# Patient Record
Sex: Female | Born: 1985 | Hispanic: No | Marital: Married | Smoking: Never smoker
Health system: Southern US, Community
[De-identification: ages and names within clinical notes are randomized; demographics above are authoritative.]

## PROBLEM LIST (undated history)

## (undated) DIAGNOSIS — E669 Obesity, unspecified: Secondary | ICD-10-CM

## (undated) HISTORY — PX: BACK SURGERY: SHX140

## (undated) HISTORY — DX: Obesity, unspecified: E66.9

---
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
## 2003-01-23 ENCOUNTER — Emergency Department (HOSPITAL_COMMUNITY): Admission: EM | Admit: 2003-01-23 | Discharge: 2003-01-23 | Payer: Self-pay | Admitting: Emergency Medicine

## 2003-01-24 ENCOUNTER — Observation Stay (HOSPITAL_COMMUNITY): Admission: EM | Admit: 2003-01-24 | Discharge: 2003-01-25 | Payer: Self-pay | Admitting: Emergency Medicine

## 2003-04-27 ENCOUNTER — Emergency Department (HOSPITAL_COMMUNITY): Admission: EM | Admit: 2003-04-27 | Discharge: 2003-04-27 | Payer: Self-pay | Admitting: Family Medicine

## 2003-06-07 ENCOUNTER — Emergency Department (HOSPITAL_COMMUNITY): Admission: EM | Admit: 2003-06-07 | Discharge: 2003-06-08 | Payer: Self-pay | Admitting: Emergency Medicine

## 2003-06-22 ENCOUNTER — Emergency Department (HOSPITAL_COMMUNITY): Admission: AD | Admit: 2003-06-22 | Discharge: 2003-06-22 | Payer: Self-pay | Admitting: Emergency Medicine

## 2003-06-22 ENCOUNTER — Emergency Department (HOSPITAL_COMMUNITY): Admission: EM | Admit: 2003-06-22 | Discharge: 2003-06-23 | Payer: Self-pay | Admitting: Emergency Medicine

## 2004-01-10 ENCOUNTER — Ambulatory Visit: Payer: Self-pay | Admitting: Family Medicine

## 2004-01-28 ENCOUNTER — Ambulatory Visit: Payer: Self-pay | Admitting: Sports Medicine

## 2004-09-14 ENCOUNTER — Emergency Department (HOSPITAL_COMMUNITY): Admission: EM | Admit: 2004-09-14 | Discharge: 2004-09-15 | Payer: Self-pay | Admitting: Emergency Medicine

## 2004-11-26 ENCOUNTER — Emergency Department (HOSPITAL_COMMUNITY): Admission: EM | Admit: 2004-11-26 | Discharge: 2004-11-26 | Payer: Self-pay | Admitting: Emergency Medicine

## 2005-01-02 ENCOUNTER — Ambulatory Visit: Payer: Self-pay | Admitting: Family Medicine

## 2005-01-02 ENCOUNTER — Encounter: Admission: RE | Admit: 2005-01-02 | Discharge: 2005-01-02 | Payer: Self-pay | Admitting: Family Medicine

## 2005-01-09 ENCOUNTER — Encounter: Admission: RE | Admit: 2005-01-09 | Discharge: 2005-04-09 | Payer: Self-pay | Admitting: Sports Medicine

## 2005-01-23 ENCOUNTER — Ambulatory Visit: Payer: Self-pay | Admitting: Family Medicine

## 2005-05-05 ENCOUNTER — Emergency Department (HOSPITAL_COMMUNITY): Admission: EM | Admit: 2005-05-05 | Discharge: 2005-05-05 | Payer: Self-pay | Admitting: Emergency Medicine

## 2005-05-20 ENCOUNTER — Observation Stay (HOSPITAL_COMMUNITY): Admission: EM | Admit: 2005-05-20 | Discharge: 2005-05-20 | Payer: Self-pay | Admitting: Emergency Medicine

## 2005-12-27 ENCOUNTER — Inpatient Hospital Stay (HOSPITAL_COMMUNITY): Admission: RE | Admit: 2005-12-27 | Discharge: 2005-12-29 | Payer: Self-pay | Admitting: Obstetrics and Gynecology

## 2006-05-16 DIAGNOSIS — M4150 Other secondary scoliosis, site unspecified: Secondary | ICD-10-CM

## 2006-05-16 DIAGNOSIS — M545 Low back pain: Secondary | ICD-10-CM

## 2007-02-27 ENCOUNTER — Emergency Department (HOSPITAL_COMMUNITY): Admission: EM | Admit: 2007-02-27 | Discharge: 2007-02-27 | Payer: Self-pay | Admitting: Emergency Medicine

## 2007-07-27 ENCOUNTER — Emergency Department (HOSPITAL_COMMUNITY): Admission: EM | Admit: 2007-07-27 | Discharge: 2007-07-27 | Payer: Self-pay | Admitting: Emergency Medicine

## 2007-09-06 ENCOUNTER — Emergency Department (HOSPITAL_COMMUNITY): Admission: EM | Admit: 2007-09-06 | Discharge: 2007-09-06 | Payer: Self-pay | Admitting: Emergency Medicine

## 2008-02-26 ENCOUNTER — Emergency Department (HOSPITAL_COMMUNITY): Admission: EM | Admit: 2008-02-26 | Discharge: 2008-02-26 | Payer: Self-pay | Admitting: Emergency Medicine

## 2008-10-05 ENCOUNTER — Other Ambulatory Visit: Admission: RE | Admit: 2008-10-05 | Discharge: 2008-10-05 | Payer: Self-pay | Admitting: Obstetrics & Gynecology

## 2008-11-10 ENCOUNTER — Ambulatory Visit (HOSPITAL_COMMUNITY): Admission: RE | Admit: 2008-11-10 | Discharge: 2008-11-10 | Payer: Self-pay | Admitting: Obstetrics & Gynecology

## 2009-03-17 ENCOUNTER — Emergency Department (HOSPITAL_COMMUNITY): Admission: EM | Admit: 2009-03-17 | Discharge: 2009-03-17 | Payer: Self-pay | Admitting: Emergency Medicine

## 2009-08-12 ENCOUNTER — Emergency Department (HOSPITAL_COMMUNITY): Admission: EM | Admit: 2009-08-12 | Discharge: 2009-08-12 | Payer: Self-pay | Admitting: Emergency Medicine

## 2009-10-02 ENCOUNTER — Emergency Department (HOSPITAL_COMMUNITY): Admission: EM | Admit: 2009-10-02 | Discharge: 2009-10-02 | Payer: Self-pay | Admitting: Emergency Medicine

## 2009-11-09 ENCOUNTER — Emergency Department (HOSPITAL_COMMUNITY): Admission: EM | Admit: 2009-11-09 | Discharge: 2009-11-09 | Payer: Self-pay | Admitting: Emergency Medicine

## 2009-11-12 ENCOUNTER — Emergency Department (HOSPITAL_COMMUNITY): Admission: EM | Admit: 2009-11-12 | Discharge: 2009-11-12 | Payer: Self-pay | Admitting: Emergency Medicine

## 2010-04-17 ENCOUNTER — Other Ambulatory Visit (HOSPITAL_COMMUNITY): Payer: Self-pay | Admitting: Emergency Medicine

## 2010-04-17 ENCOUNTER — Emergency Department (HOSPITAL_COMMUNITY)
Admission: EM | Admit: 2010-04-17 | Discharge: 2010-04-17 | Payer: Self-pay | Source: Home / Self Care | Admitting: Emergency Medicine

## 2010-04-17 DIAGNOSIS — K829 Disease of gallbladder, unspecified: Secondary | ICD-10-CM

## 2010-04-17 LAB — HEPATIC FUNCTION PANEL
Albumin: 4.1 g/dL (ref 3.5–5.2)
Alkaline Phosphatase: 48 U/L (ref 39–117)
Bilirubin, Direct: <0.1 mg/dL (ref 0.0–0.3)
Total Bilirubin: 0.3 mg/dL (ref 0.3–1.2)
Total Protein: 6.7 g/dL (ref 6.0–8.3)

## 2010-04-17 LAB — URINALYSIS, ROUTINE W REFLEX MICROSCOPIC
Ketones, ur: NEGATIVE mg/dL
Leukocytes, UA: NEGATIVE
Nitrite: NEGATIVE
Protein, ur: NEGATIVE mg/dL
Specific Gravity, Urine: 1.015 (ref 1.005–1.030)
Urine Glucose, Fasting: NEGATIVE mg/dL
Urobilinogen, UA: 0.2 mg/dL (ref 0.0–1.0)
pH: 6 (ref 5.0–8.0)

## 2010-04-17 LAB — BASIC METABOLIC PANEL
CO2: 26 mEq/L (ref 19–32)
Calcium: 9.3 mg/dL (ref 8.4–10.5)
Chloride: 103 mEq/L (ref 96–112)
GFR calc Af Amer: >60 mL/min (ref >60)
GFR calc non Af Amer: >60 mL/min (ref >60)
Glucose, Bld: 85 mg/dL (ref 70–99)
Potassium: 3.6 mEq/L (ref 3.5–5.1)

## 2010-04-17 LAB — CBC
MCV: 86.1 fL (ref 78.0–100.0)
Platelets: 275 10*3/uL (ref 150–400)
RDW: 12 % (ref 11.5–15.5)

## 2010-04-17 LAB — DIFFERENTIAL
Basophils Relative: 0 % (ref 0–1)
Lymphs Abs: 2.1 10*3/uL (ref 0.7–4.0)
Neutrophils Relative %: 67 % (ref 43–77)

## 2010-04-17 LAB — PREGNANCY, URINE: Preg Test, Ur: NEGATIVE

## 2010-04-19 ENCOUNTER — Ambulatory Visit (HOSPITAL_COMMUNITY)
Admission: RE | Admit: 2010-04-19 | Discharge: 2010-04-19 | Disposition: A | Discharge status: Home / Self Care | Payer: Self-pay | Source: Ambulatory Visit | Attending: Emergency Medicine | Admitting: Emergency Medicine

## 2010-04-19 DIAGNOSIS — R109 Unspecified abdominal pain: Secondary | ICD-10-CM | POA: Insufficient documentation

## 2010-04-19 DIAGNOSIS — K829 Disease of gallbladder, unspecified: Secondary | ICD-10-CM

## 2010-06-24 LAB — URINALYSIS, ROUTINE W REFLEX MICROSCOPIC
Ketones, ur: NEGATIVE mg/dL
Leukocytes, UA: NEGATIVE
Nitrite: NEGATIVE
Protein, ur: NEGATIVE mg/dL
Specific Gravity, Urine: 1.02 (ref 1.005–1.030)
Urobilinogen, UA: 1 mg/dL (ref 0.0–1.0)

## 2010-06-24 LAB — COMPREHENSIVE METABOLIC PANEL
ALT: 32 U/L (ref 0–35)
Alkaline Phosphatase: 49 U/L (ref 39–117)
BUN: 8 mg/dL (ref 6–23)
CO2: 27 mEq/L (ref 19–32)
GFR calc non Af Amer: >60 mL/min (ref >60)
Glucose, Bld: 89 mg/dL (ref 70–99)
Potassium: 3.7 mEq/L (ref 3.5–5.1)
Total Bilirubin: 0.5 mg/dL (ref 0.3–1.2)

## 2010-06-24 LAB — CBC
HCT: 35.6 % — ABNORMAL LOW (ref 36.0–46.0)
Hemoglobin: 12.6 g/dL (ref 12.0–15.0)
Platelets: 252 10*3/uL (ref 150–400)
RBC: 3.95 MIL/uL (ref 3.87–5.11)
WBC: 5.7 10*3/uL (ref 4.0–10.5)

## 2010-06-24 LAB — HCG, QUANTITATIVE, PREGNANCY: hCG, Beta Chain, Quant, S: <2 m[IU]/mL (ref <5)

## 2010-08-01 NOTE — Op Note (Signed)
NAME:  Karen Rosales, Karen Rosales NO.:  [id number]   MEDICAL RECORD NO.:  [id number]          PATIENT TYPE:  AMB   LOCATION:  DAY                           FACILITY:  [facility]   PHYSICIAN:  Lazaro Arms, M.D.   DATE OF BIRTH:  04-24-1985   DATE OF PROCEDURE:  11/10/2008  DATE OF DISCHARGE:                               OPERATIVE REPORT   PREOPERATIVE DIAGNOSES:  1. Multiparous female desires permanent sterilization.  2. Desires removal of Implanon.   POSTOPERATIVE DIAGNOSES:  1. Multiparous female desires permanent sterilization.  2. Desires removal of Implanon.   PROCEDURE:  1. Laparoscopic tubal ligation using electrocautery.  2. Implanon removal from left upper arm.   SURGEON:  Lazaro Arms, MD   ANESTHESIA:  Spinal.   FINDINGS:  The patient had a normal uterus, tubes, and ovaries.  Intraperitoneal cavity was completely normal.   DESCRIPTION OF OPERATION:  The patient was taken to the operating room,  placed in the supine position, where she underwent general endotracheal  anesthesia, placed in dorsal spine position, prepped and draped in usual  sterile fashion.  The vagina was prepped as well.  She had a Hulka  tenaculum placed in the endometrium for uterine manipulation.  Incision  was made in the umbilicus and a non bladed trocar was used under direct  visualization placed on peritoneal cavity one pass had difficulty.  The  peritoneal cavity was insufflated on view of video laparoscope.  The  fallopian tubes were identified and burned.  No resistance up beyond  using electrocautery.  Approximately, 2-1/2-3 cm segment bilaterally was  burned.  Again, all the anatomy was normal.  The trocars removed.  Gas  allowed to escape.  The fascia was closed with a single interrupted  suture.  The skin was closed using skin staples.  The patient tolerated  the procedure well.  She experienced minimal blood loss.  The left upper  arm was prepped.  Incision was made.   Implanon device was removed and  then discarded, and it was dressed.  The patient was awakened from  anesthesia and taken to recovery room in good and stable condition.  All  counts were correct x3.     Lazaro Arms, M.D.  Electronically Signed    LHE/MEDQ  D:  11/10/2008  T:  11/10/2008  Job:  [id number]

## 2010-08-04 NOTE — H&P (Signed)
NAME:  Karen Rosales, Karen Rosales NO.:  [id number]   MEDICAL RECORD NO.:  [id number]          PATIENT TYPE:  INP   LOCATION:  A402                          FACILITY:  [facility]   PHYSICIAN:  Tilda Burrow, M.D. DATE OF BIRTH:  Oct 17, 1985   DATE OF ADMISSION:  DATE OF DISCHARGE:  LH                                HISTORY & PHYSICAL   ADMISSION DIAGNOSIS:  Pregnancy, 39+ weeks. Repeat cesarean section. Not for  trial of labor.   HISTORY OF PRESENT ILLNESS:  This 25 year old female, gravida 2, para 1, AB  0, last menstrual period January 5, placing menstrual [facility] December 28, 2005  with ultrasound [facility] of January 12, 2006 based on first and second trimester  ultrasounds, is admitted at 39 weeks 6 days by menstrual criteria, 38 weeks  by ultrasound criteria. She has been followed through our office since  February with pregnancy notable for ultrasound suggesting an echogenic  intracardiac focus noted here, confirmed at [facility] with patient  declining amniocentesis with no followup considered necessary or planned.  Four-chamber heart was noted. Pregnancy was otherwise straightforward.   PRENATAL LABORATORY DATA:  Include blood type A positive, urine drug screen  negative, rubella immunity present, hemoglobin 12, hematocrit 37; hepatitis,  HIV, RPR, GC and chlamydia all negative. Down syndrome risk of 1:610.  Hemoglobin 10, hematocrit 32. Glucose tolerance test 128 mg%.   She has a prior cesarean section, and repeat will be performed.   PAST MEDICAL HISTORY:  For PIH.   PAST SURGICAL HISTORY:  Cesarean section and rods for scoliosis.   ALLERGIES:  None.   HABITS:  Cigarettes, alcohol, and recreational drugs:  Denied.   GENERAL EXAM:  Height 5 feet, weight 170, blood pressure 150/82. Pupils are  equal, round, and reactive.  NECK:  Supple. Trachea midline.  CHEST:  Clear to auscultation.  ABDOMEN:  38 cm fundal height. Estimated fetal weight 7-1/2 pounds.  CERVIX:   Closed, long, thick at last check.   PLAN:  Repeat cesarean section December 27, 2005.   ADDENDUM:  The patient is expecting to have a girl. Will bottle feed and  plans Implanon contraception in the future.      Tilda Burrow, M.D.  Electronically Signed     JVF/MEDQ  D:  12/25/2005  T:  12/25/2005  Job:  [id number]   cc:   Francoise Schaumann. Milford Cage DO, FAAP  Fax: [phone]

## 2010-08-04 NOTE — H&P (Signed)
NAME:  Karen, Rosales                              ACCOUNT NO.:  [id number]   MEDICAL RECORD NO.:  [id number]                   PATIENT TYPE:  OBV   LOCATION:  5011                                 FACILITY:  [facility]   PHYSICIAN:  Sibyl Parr. Fields, M.D.                DATE OF BIRTH:  August 19, 1985   DATE OF ADMISSION:  01/24/2003  DATE OF DISCHARGE:                                HISTORY & PHYSICAL   CHIEF COMPLAINT:  Left flank pain.   SUBJECTIVE:  A 25 year old Caucasian female presented to the ER on November  6 after two days of dysuria, left flank pain, nausea, and vomiting. She was  diagnosed with pyelonephritis by a positive urinalysis while in the ED  yesterday and given one dose of Rocephin. She was also sent home with a  prescription for Ciprofloxacin, but never filled it. She continued to feel  increased amounts of left flank pain this morning and reported back to the  ER. She has been unable to keep down fluids and has been complaining of  subjective fevers and chills. She denies any history of urinary tract  infection in the past.   REVIEW OF SYSTEMS:  CONSTITUTIONAL:  Subjective fevers and chills x2-3 days.  CARDIOVASCULAR: Denies chest pain or palpitations. RESPIRATORY:  Denies  shortness of breath. GI:  Left lower quadrant and left flank pain, nausea,  and vomiting. Poor p.o. intake. SKIN:  Denies rashes, but has bruising on  both of her thighs. NEUROLOGIC:  Denies headache. MUSCULOSKELETAL:  Denies  myalgias. ENT:  Denies recent URI. ENDOCRINE:  Denies a history of diabetes.  GU:  Denies vaginal discharge, but has had dysuria and increased urinary  frequency. Negative for hematuria.   PAST MEDICAL HISTORY:  1. She is G1, P1, 0-0-66 with a 37-month-old son after a C-section.  2. History of scoliosis, status post metal rod placement at age 74.   MEDICATIONS:  Oral contraceptives.   ALLERGIES:  No known drug allergies.   FAMILY HISTORY:  Noncontributory.   SOCIAL HISTORY:   She is sexually active with one partner using condoms.  Denies any history of STDs. Last Pap smear one month ago at the health  department reportedly normal. Denies alcohol, drug use, or smoking. She  lives in [place] with her 67-month-old son and her mother.   OBJECTIVE:  VITAL SIGNS: Temperature 100.8, pulse 116, respiratory rate 16,  blood pressure 134/75, O2 saturation 99% on room air.  GENERAL:  A pleasant white female in no acute distress. Alert and oriented  x3.  HEENT:  Head normocephalic, puffy eyelids and facial pallor. No injection of  the conjunctivae. Mucous membranes pink and moist with no injection of the  oropharynx.  NECK:  Supple without lymphadenopathy or thyromegaly.  LUNGS:  Clear to auscultation bilaterally with nonlabored respirations. No  wheezing, rales, or rhonchi.  HEART:  Tachycardic with a regular  rhythm and a 2/6 systolic flow murmur  best heard at the left upper sternal border.  EXTREMITIES:  Without edema, clubbing, or cyanosis.  ABDOMEN:  Left lower quadrant and left upper quadrant tenderness to  palpation with voluntary guarding. No rebound tenderness. Nondistended.  Normal active bowel sounds. Significant left-sided CVA tenderness.  SKIN:  Diffuse pallor, but pink mucous membranes. Capillary refill less than  two seconds. Three, small 0.5 mm round ecchymoses on both sides in the same  location bilaterally.  NEUROLOGIC:  Cranial nerves 2-12 grossly intact bilaterally.   LABORATORY DATA:  CBC shows a white count of 7.5, hemoglobin 11.6,  hematocrit 33.5, platelet count 129, creatinine 0.6 on an I-Stat. From  November 6 her urinalysis showed cloudy urine with a specific gravity of  1.013, pH 5.5, glucose negative, bili negative, ketones greater than 80,  small amount of blood, 30 protein, nitrite negative, Leukocyte esterase  moderate, 11-20 WBCs, 0-2 RBCs, few bacteria. November 6, urine pregnancy  test negative, urinary C&S pending.   ASSESSMENT:   A 25 year old female admitted for 23 hour observation for  presumed pyelonephritis based on a positive UA and clinical symptoms who  failed outpatient management, unable to keep down p.o. medications.   PLAN:  1. Pyelonephritis.  Performed clean catch UA, which does appear to be     infected urine. Awaiting the cultures and sensitivity. Will start her     empirically on Cipro IV since she is unable to keep down p.o. She does     have a low grade fever on admission, but no leukocytosis. Clinically she     does have significant left flank pain on exam. If she is able to take     p.o. tomorrow will change her to p.o. ciprofloxacin and plan to treat her     for 14 days. She will need a test of cure follow-up urinalysis after     treatment. This is her first history of UTI and do not need any further     workup.  2. Persistent vomiting.  IV fluid bolus of 1 L given in the ED. Will     administer another 500 cc since she is tachycardic and appears to be     mildly volume depleted. I will then place her on maintenance IV fluids     and a clear liquid diet as tolerated. Will treat her nausea with IV     Phenergan. Rule out other causes of persistent vomiting including     pregnancy with a negative urine pregnancy test yesterday. It is unlikely     since her last menstrual period was two weeks ago. Will review a CMET to     rule out hepatitis as etiology of persistent vomiting given her abdominal     pain.  3. Left lower quadrant pain. Tender suprapubically more on the left side     likely related to her UTI pyelonephritis. No rebound tenderness and does     not appear to be an acute abdomen. She does not appear to have vaginal     discharge given her symptoms of no STDs. She denies any history of STDs     and reportedly has protected sex with one partner. She reports a normal    Pap smear one month ago at the health department. If she changes     clinically would not hesitate to do a pelvic  exam with GC and Chlamydia     cultures.  4. Thrombocytopenia.  Likely related to her acute infection. May be the     cause of easy bruising seen by the small ecchymoses on her thighs. Will     follow-up a CBC.    DISPOSITION:  If able to take p.o. and clinically improving may discharge  her home tomorrow with close follow-up at the [facility].      Lorne Skeens, D.O.                         Sibyl Parr. Darrick Penna, M.D.    Erick Alley  D:  01/24/2003  T:  01/24/2003  Job:  [id number]   cc:   Nilda Simmer, M.D.  [facility]  [address] [place]  [place] [address]  Fax: [phone]

## 2010-08-04 NOTE — Discharge Summary (Signed)
NAME:  Karen Rosales, Karen Rosales                              ACCOUNT NO.:  [id number]   MEDICAL RECORD NO.:  [id number]                   PATIENT TYPE:  OBV   LOCATION:  5011                                 FACILITY:  [facility]   PHYSICIAN:  Sibyl Parr. Fields, M.D.                DATE OF BIRTH:  1985/05/05   DATE OF ADMISSION:  01/24/2003  DATE OF DISCHARGE:  01/25/2003                                 DISCHARGE SUMMARY   PRIMARY CARE PHYSICIAN:  Nilda Simmer, M.D., [facility] at [facility].   DISCHARGE DIAGNOSES:  1. Pyelonephritis.  2. Hypokalemia.  3. Thrombocytopenia.   DISCHARGE MEDICATIONS:  1. Cipro 500 mg p.o.  b.i.d. x1 week.  2. Ibuprofen 600 mg every 6 hours as needed for pain.   CONSULTATIONS:  None.   PROCEDURE:  None.   ADMISSION HISTORY OF PRESENT ILLNESS:  This is a 25 year old female who  presented to the emergency room on November 6th after two days of dysuria,  flank pain, nausea and vomiting.  She was diagnosed with pyelonephritis by a  positive urinalysis while in the emergency department and was given one dose  of Rocephin.  She was also sent home with a prescription for ciprofloxacin  but never filled it.  She continued to have increased left flank pain on the  morning of admission and reported back to the emergency room.  She had  nausea and vomiting and subjective fever and chills.  She denies any history  of urinary tract infections in the past.   PHYSICAL EXAMINATION:   VITAL SIGNS:  Temperature 100.8, pulse 116, respiratory rate 16, blood  pressure 134/75.  Oxygen saturation 99% on room air.   GENERAL:  No acute distress.   HEENT:  Mucous membranes are pink and moist.  Puffy eyelids and facial  pallor.   LUNGS:  Clear to auscultation bilaterally with nonlabored respirations.  No  wheezing, rales, or rhonchi.   CARDIOVASCULAR:  Tachycardic with a regular rhythm and a 2/6 systolic flow  murmur heard best at the left upper sternal border.   EXTREMITIES:  No edema.   ABDOMEN:  Left lower quadrant and left upper quadrant tenderness to  palpation with voluntary guarding.  No rebound tenderness.  Normoactive  bowel sounds.  Significant left-sided CVA tenderness.   SKIN:  Three small 0.5 mm round ecchymoses on both sides in the same  location bilaterally.   ADMISSION LABORATORY DATA:  CBC:  White count 7.5, hemoglobin 11.6,  hematocrit 33.5, platelet count 129.  Creatinine 0.6 on I-STAT.  On November  6th, her urinalysis showed cloudy urine with specific gravity of 1.013, pH  5.5, glucose negative, bili negative, ketones greater than 80, small amount  of blood, 30 protein, nitrate negative, leukocyte esterase moderate, 11-20  white blood cells, 0-2 red blood cells, few bacteria.  Urine pregnancy test  was negative.  Urine  culture grew E. coli that was sensitive to Cipro.  Blood culture x2 was no growth.   HOSPITAL COURSE:  The patient was admitted for pyelonephritis under 23-hour  observation, who failed outpatient management and was unable to keep down  p.o. medications.   Problem 1.  Pyelonephritis:  A clean-catch urinalysis showed signs of  infection.  Urine culture grew E. coli that was sensitive to ciprofloxacin.  The patient had a low-grade fever on admission but no leukocytosis.  She had  significant left flank pain on exam.  Ciprofloxacin was started IV and was  then changed to p.o. after the patient was able to tolerate p.o.  medications.  This is her first urinary tract infection and will not need  any further workup.  The patient was given a prescription for Cipro p.o. to  be continued for one week.  Problem 2.  Persistent vomiting:  The patient was given a fluid bolus of 1 L  in the emergency department and was hydrated with IV fluids while  hospitalized.  The patient was given IV Phenergan.  IV fluids were DC'd once  the patient could tolerate p.o.  Urine pregnancy test was negative, and LFTs  were normal.  The  patient was able to take medications p.o. and no longer  had nausea and vomiting upon discharge.  Problem 3.  Hypokalemia:  The patient was found to be hypokalemic with a  potassium level of 3.1.  Potassium was repleted IV during hospitalization  and an additional dose of K-Dur was given p.o. before discharge.  On  discharge, the patient's potassium level was normal at 3.8.  Problem 4.  Thrombocytopenia:  The patient's platelet count was found to be  low on admission.  The level was 129.  This was thought to be consistent  with the fact that the patient had a skin infection.  On discharge, the  patient's platelet count was 149.   DISPOSITION:  Patient was discharged in stable condition.   PAIN MANAGEMENT:  Ibuprofen 600 mg p.o. every 6 hours as needed for pain.   ACTIVITY:  As tolerated.   FOLLOW UP:  Patient was instructed to call the [facility] for an appointment with Dr. Nilda Simmer for followup.      Nilda Simmer, M.D.                        Sibyl Parr. Darrick Penna, M.D.    KS/MEDQ  D:  01/26/2003  T:  01/26/2003  Job:  [id number]
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
## 2010-08-04 NOTE — Discharge Summary (Signed)
NAME:  Karen Rosales, Karen Rosales                  ACCOUNT NO.:  [id number]   MEDICAL RECORD NO.:  [id number]          PATIENT TYPE:  INP   LOCATION:  A428                          FACILITY:  [facility]   PHYSICIAN:  Langley Gauss, MD     DATE OF BIRTH:  1985-10-15   DATE OF ADMISSION:  05/20/2005  DATE OF DISCHARGE:  03/04/2007LH                                 DISCHARGE SUMMARY   HISTORY OF PRESENT ILLNESS:  The patient is a 25 year old, G2, P1 with one  prior low transverse cesarean section at about 8 weeks' gestation who  presented to [facility] with 2-day duration of nausea and vomiting  with eventual development of some blood within the vomitus.   PRENATAL COURSE:  She has received prenatal care through [facility].  She has had a 7-week ultrasound which documented a viable intrauterine  pregnancy.  She has had no nausea or vomiting up to this point other than  the past 48 hours.  She stated that she vomited greater than 20 times.  She  denies any vaginal bleeding and any leakage of fluid.  She does have breast  tenderness.  She has been unable to tolerate any p.o. intake to include  fluids.   PAST MEDICAL HISTORY:  One prior low transverse cesarean section at [facility].  No other medical or surgical history.   ALLERGIES:  No known drug allergies.   CURRENT MEDICATIONS:  None.  She has not been able to tolerate her prenatal  vitamins.   PHYSICAL EXAMINATION:  GENERAL:  In no acute distress.  VITAL SIGNS:  BP 129/76, pulse 92, respirations 20.  ABDOMEN:  Soft and nontender.  Pfannenstiel incision from prior C-section.  No abdominal or pelvic masses.  No abdominal or pelvic rebound or guarding.  PELVIC:  Not performed.   LABORATORY DATA AND X-RAY FINDINGS:  Presence of ketones in the urine.  Urinalysis is otherwise completely unremarkable with negative esterase and  negative nitrites.  Electrolytes within normal limits.  White count 12.5,  hemoglobin 12.4, hematocrit  35.2.   OBSERVATION COURSE:  The patient initially presented to the emergency room  on May 19, 2005, under the care of Dr. Colon Branch.  She had received two bags  of IV fluids.  She continued to have nausea and vomiting and was unable to  tolerate any p.o. intake.  Consultation requested on May 20, 2005, by Dr.  Colon Branch to Dr. Lisette Grinder.  The patient had been treated at that time for a  presumed urinary tract infection with 1 g of IV Rocephin.   I consulted on the patient and provided care for her and discharge on May 20, 2005, with continuation of the IV fluids and IM Phenergan.  The patient  was able to tolerate p.o. intake.  Nausea and vomiting have completely  resolved.  She also states that she will be able to tolerate p.o.  medications.  She is to resume her prenatal vitamins and is given a  prescription for p.o. Phenergan to take on a p.r.n. basis for nausea and  vomiting.  Subsequently, she will follow up with [facility] for  continued care.      Langley Gauss, MD  Electronically Signed     DC/MEDQ  D:  05/20/2005  T:  05/21/2005  Job:  [id number]   cc:   Nicoletta Dress. Colon Branch, M.D.  Fax: [phone]

## 2010-08-04 NOTE — Discharge Summary (Signed)
[facility]  Patient:    Karen Rosales, Karen Rosales Visit Number: [id number] MRN: [id number]          Service Type: OBS Location: [facility] Attending Physician:  Enid Cutter Admit Date:  09/25/2001 Disc. Date: 09/23/01   CC:         Christin Bach, M.D., [facility], [place], [place]  Phone [phone]   Discharge Summary  DICTATING PHYSICIAN NOT KNOWN.  ADMITTING DIAGNOSIS:              Preeclampsia.  DISCHARGE DIAGNOSIS:              Mild preeclampsia.  ATTENDING:                        Dr. Elinor Dodge, [facility] Teaching Service.  HISTORY OF PRESENT ILLNESS:       The patient is a 25 year old G2, P0-0-1-0 who at 34-1/7 weeks was transferred from [facility] to [facility] for the treatment of preeclampsia.  The patient had originally presented to her OB-GYN Dr. Emelda Fear on September 10, 2001 complaining of headaches and fatigue.  At that time she was found to have elevated blood pressures up to 140/100 as well as lower extremity edema, facial edema, and trace protein on a dip urine.  She was admitted to [facility] at that time where she received 2 doses of betamethasone and was treated for preeclampsia.  She was transferred [facility] when her mother became aware of her pregnancy and as legal guardian asked that she be transferred to [facility] for convenience since lives in [place].                                    Prenatal care for the patient began around 25 weeks with Dr. Emelda Fear of [facility].  Her only previous pregnancy resulted in a spontaneous abortion in October 2002 at about 44-month gestation. She denied any history of STDs or abnormal Pap smears and prenatal labs were normal.  PHYSICAL EXAMINATION:             She was afebrile with blood pressure of 140/86, 1+ edema in the lower extremities bilaterally and 2+ deep tendon reflexes without clonus.  Fetal heart tracing  showed a baseline rate in the 150s-160s with good variability, good reactivity, and occasional mild variable decels.  Tocometer showed about three contractions an hour with some uterine irritability.  A 24-hour urine was begun.  Uric acid level was found to be 4.6, platelets were 160, and liver function tests were normal.  The patient was admitted, placed on continuous external fetal monitoring and her preeclampsia managed.  HOSPITAL COURSE:                  Throughout her hospital stay the patient was clinically stable with well-controlled blood pressures.  A 24-hour urine protein from September 14, 2001 at [facility] showed 1.725 g of protein in 24 hours.  A 24-hour urinary protein was repeated on September 16, 2001 and was found to be 1.1 g of protein in 24 hours.  A 24-hour urinary creatinine on that same date was 1163 mg per 24 hours which is normal.  PIH labs remained within normal limits.  A hemoglobin was measured at 9.8 so the patient was started on oral iron supplementation.  Throughout her hospital stay external fetal monitoring was reassuring with good variability and reactivity and occasional variable decels.  Later on in her hospital stay and around September 22, 2001 the patient did for the first time complain of some preeclamptic symptoms including headache and nausea but these resolved without incident and her PIH labs remained within normal limits.  A repeat 24-hour urine was collected on September 21, 2001 which found a urinary protein of 1.358 g in 24 hours and a normal 24-hour urine creatinine.  With her mild preeclampsia under control, stable blood pressures, normal PIH labs and stable mild proteinuria, the patient was discharged home for outpatient management of her preeclampsia. She will be living with her mother in [place] for the remainder of her pregnancy and will have access to transportation for adequate outpatient monitoring.  DISCHARGE CONDITION:               Stable.  DISPOSITION:                      Discharged to home with mother.  DISCHARGE MEDICATIONS:            1. Ferrous sulfate 325 mg tablet one p.o.                                      daily for anemia.                                   2. Prenatal vitamins one p.o. daily.  DISCHARGE INSTRUCTIONS:           Activity as tolerated.  Normal diet.  FOLLOWUP CARE:                    She is to follow up at maternity admissions at [facility] on Thursday, September 25, 2001. Attending Physician:  Enid Cutter DD:  09/23/01 TD:  09/26/01 Job: [phone] [id number]

## 2010-08-04 NOTE — Discharge Summary (Signed)
NAME:  Karen Rosales, Karen Rosales                          ACCOUNT NO.:  [id number]   MEDICAL RECORD NO.:  [id number]                   PATIENT TYPE:  INP   LOCATION:  9127                                 FACILITY:  [facility]   PHYSICIAN:  Kerrie Buffalo, M.D.               DATE OF BIRTH:  September 12, 1985   DATE OF ADMISSION:  10/04/2001  DATE OF DISCHARGE:  10/09/2001                                 DISCHARGE SUMMARY   ADMISSION DIAGNOSIS:  The patient is a 25 year-old gravida 2, para 0-0-1-0  at 36-6/7 weeks with pre-eclampsia and onset of contractions.   DISCHARGE DIAGNOSIS:  Gravida 2, para 1-0-1-1 postoperative day #4, status  post low transverse cesarean section for failure to progress, pre-eclampsia.   PROCEDURE:  Low transverse cesarean section was performed on July 20, by Dr.  Shearon Balo.  The indication was failure to progress.   BRIEF HOSPITAL COURSE:  The patient is a 25 year-old who was a gravida 2,  para 0-0-1-0 who presented to the [facility] at 36-6/7 weeks with onset of  contractions. She had had a notable history for pre-eclampsia with elevated  blood pressures, a 24 hour protein of 7,020 mg of protein and some edema.  The patient was originally evaluated by Dr. Emelda Fear at [facility] in [place], but her care was transferred over  to the [facility] teaching service on June 25, for management of pre-eclampsia.  She  had a number of hospital stays and visits for evaluation of her pre-  eclampsia which culminated with this hospital stay and delivery of a female  infant by cesarean section.   The patient presented in early labor and was begun on induction with Cytotec  on October 04, 2001.  When she was approximately 3 cm dilated, there was  artificial rupture of membranes and the patient was started on Pitocin.  She  progressed slowly and after a number of hours and a dilation of 4-5 cm, it  was decided to perform a low transverse cesarean section.  The cesarean  section was  performed with no complications.  The patient was begun on  magnesium sulfate when she was 3-4 cm dilated which was on the evening of  July 20.  She was continued on magnesium throughout delivery and for  approximately 48 hours postpartum.  She showed fluid progressing diuresis.   On July 22, at 1800 the magnesium was stopped.  The patient continued to  have a fair diuresis and was transferred to the floor. At discharge, the  patient continued to have blood pressures that were in the 140-160s systolic  and 80-90s diastolic on average with a one time maximum blood pressure of  189/107.  She was started on hydrochlorothiazide 25 mg p.o. q.d.  The edema  that she had before was improved.  Her laboratory results which were  essentially normal, continued to be so at discharge.  Her hemoglobin  postpartum was 9.4.  Platelets were 142,000.  Uric acid was 5.9.  AST was 15  and ALT was less than 19.   At discharge, the patient was feeling well with no symptoms, only of a small  amount of incisional pain. She felt ready to go home.   On October 05, 2001, at 22:25, a female viable infant was delivered by cesarean  section.  Apgars were 4 at one minute and 9 at five minutes.  The infant  weighed 2,855 grams, length was 19.5 inches.  There were no noted  complications with the procedure and the procedure note has already been  dictated.  A three vessel placenta was delivered at 22:26.   DISCHARGE INSTRUCTIONS:  1. The patient was given prescriptions for Percocet 5/325 mg to take one     p.o. q.4 h. as needed #20, ibuprofen 600 mg to take one p.o. q.6 h. as     needed for pain. The patient has oral contraceptive pills at home that     she does not recall the name of, but says that she would like to use.     She was instructed to start these birth control pills on the second     Sunday after delivery. She is to take one q.d..  The patient was given a     prescription for hydrochlorothiazide 25 mg to take  one pill q.d..  The     patient was given a prescription for prenatal vitamins and instructed to     take one q.d. x6 weeks.  2. She is going to bottle feed.  3. Diet at home is regular.  4. Activities:  No heavy  lifting for four weeks.  5. The patient is instructed to return to maternity admissions at [facility] in four days for a blood pressure check.                                               Kerrie Buffalo, M.D.    Derinda Sis  D:  10/09/2001  T:  10/18/2001  Job:  [id number]

## 2010-08-04 NOTE — Op Note (Signed)
[facility]  Patient:    Karen Rosales, Karen Rosales Visit Number: [id number] MRN: [id number]          Service Type: OBS Location: 910A 9127 01 Attending Physician:  Enid Cutter Dictated by:   Shearon Balo, M.D. Proc. Date: 10/05/01 Admit Date:  10/04/2001                             Operative Report  PREOPERATIVE DIAGNOSIS:       25 year old gravida 1 at 19 weeks with preeclampsia, induction of labor with arrest of dilatation at 4-5 cm dilation, 80% effacement and 0 station.  POSTOPERATIVE DIAGNOSIS:      25 year old gravida 1 at 19 weeks with preeclampsia, induction of labor with arrest of dilatation at 4-5 cm dilation, 80% effacement and 0 station.  OPERATION:                    Primary low transverse cesarean section.  SURGEON:                      Shearon Balo, M.D.  ASSISTANT:                    Zenaida Niece, M.D.  ANESTHESIA:                   Epidural.  COMPLICATIONS:                None.  ESTIMATED BLOOD LOSS:         500 cc.  DISPOSITION:                  Recovery room stable.  INDICATIONS:                  The patient is a 25 year old G1 at 17 weeks who presented in early labor.  Induction of labor was continued with Cytotec until the patient reached approximately 3 cm dilated.  Artificial rupture of membranes was performed, and the patient was started on Pitocin.  The patient progressed very slowly through the day and reached 4-5 cm.  She was in adequate labor for five to six hours before decision was made to perform low transverse cesarean section.  The patient was counseled on the risks of cesarean including the risks of infection, bleeding, injury to internal organs, risks of transfusion and emergent hysterectomy.  The patient understands and wished to proceed.  DESCRIPTION OF PROCEDURE:     The patient was taken to the operating room where epidural anesthesia was bolused.  She was prepped and draped in a sterile  fashion.  A Pfannenstiel incision was performed with the scalpel and carried down to the underlying fascia.  The fascia was entered sharply and dissected laterally with Mayo scissors.  The fascia was separated from the underlying rectus muscle bellies with sharp and blunt dissection.  The muscles were separated in the midline and the peritoneum was entered sharply.  The bladder blade was placed and bladder flap was created with sharp and blunt dissection.  The bladder blade was placed and the uterus was scored with a scalpel blade.  The surgeons fingers were then used to extend the incision laterally, and the infants head was grasped and delivered through the uterine incision.  The infant was bulb suctioned on the operative field.  The body was delivered and the cord was clamped  and cut.  The infant was handed off to the awaiting neonatal resuscitation team.  The uterus was then manually extracted and curetted with a dry lap sponge.  The uterine incision was then repaired with a running locking stitch of 0 chromic.  The second suture was placed in the right corner to obtain final hemostasis.  The uterus was returned to the abdominal cavity and the pericolic gutters were emptied of clot and debris. The fascia was then closed with a running stitch of 0 Vicryl.  The subcutaneous tissues were irrigated and the skin edges were reapproximated with staples.  The patient tolerated the procedure well and returned to the recovery room in stable condition.  Sponge, needle and instrument counts were correct at the end of the procedure. Dictated by:   Shearon Balo, M.D. Attending Physician:  Enid Cutter DD:  10/05/01 TD:  10/08/01 Job: [id number]

## 2010-08-04 NOTE — H&P (Signed)
[facility]  Patient:    Karen Rosales, SPIERS Visit Number: [id number] MRN: [id number]          Service Type: MED Location: LDR LDR3 01 Attending Physician:  Tilda Burrow Dictated by:   Zerita Boers, N.M. Admit Date:  09/10/2001   CC:         [facility]   History and Physical  REASON FOR ADMISSION:  Pregnancy at 33 weeks 4 days with pregnancy-induced hypertension versus preeclampsia.  HISTORY OF PRESENT ILLNESS:  The patient came into the office on June 25, complaining of malaise and general fatigue and nightly headaches.  Upon physical exam at that time revealed blood pressures 138/90 and 140/100.  DTRs were 2+, 3+.  At that time she had 1+ pitting edema in her lower extremities, facial edema and trace of protein on a dip.  She was then sent to the hospital for observation.  Catheterized urine revealed 1+.  During the course she became normotensive, but still continues to have 3+ and 4+ brisk DTRs and admission is felt at this time.  MEDICAL HISTORY:  Negative.  SURGICAL HISTORY:  She had back surgery in December of 2002 for scoliosis.  SOCIAL HISTORY:  She is single, but the father of the baby is here and he is supportive.  PRENATAL COURSE:  She had late entry prenatal care at 25 weeks.  She also had surgery for scoliosis in early pregnancy and the hospital who did her surgery was unaware of her pregnancy at that time.  Blood type is O positive.  UDS is negative.  Hepatitis B surface antigen negative.  HIV is negative.  GC and Chlamydia are negative.  MSAFP is within normal limits.  A 28-week hemoglobin was 11, 28-week hematocrit was 33.7.  One-hour glucose was 59.  ADMISSION LABORATORIES:  On the CMP she had a total protein of 5, an albumin of 2.2.  A hemoglobin is 10.3, hematocrit 30.0, platelets 180.  Uric acid 4.3.  PHYSICAL EXAMINATION TODAY:  HEART:  Regular to rhythm and rate.  VITAL SIGNS:  Blood pressures after 24 hours  of bed rest 110s/60s and 70s.  EXTREMITIES:  She does have 1+ pitting edema in the lower extremities and 3+ DTRs.  PLAN:  We are going to admit, continue her 24-hour urine, complete her betamethasone this afternoon and monitor her pressures and reflexes. Dictated by:   Zerita Boers, N.M. Attending Physician:  Tilda Burrow DD:  09/11/01 TD:  09/11/01 Job: [id number]

## 2010-08-04 NOTE — Discharge Summary (Signed)
NAMESTEPH, CHEADLE NO.:  [id number]   MEDICAL RECORD NO.:  [id number]          PATIENT TYPE:  INP   LOCATION:  A402                          FACILITY:  [facility]   PHYSICIAN:  Tilda Burrow, M.D. DATE OF BIRTH:  1986-02-09   DATE OF ADMISSION:  12/27/2005  DATE OF DISCHARGE:  10/13/2007LH                                 DISCHARGE SUMMARY   ADMITTING DIAGNOSES:  1. Pregnancy, 39 weeks' gestation.  2. Repeat cesarean section, not for a trial of labor.  3. Nutritional anemia.   DISCHARGE DIAGNOSES:  1. Pregnancy 39 weeks', delivered.  2. Repeat cesarean section, not for trial of labor.   PROCEDURES:  1. On December 27, 2005, transfusion 1 unit packed cells.  2. Repeat low transverse cervical cesarean section.  3. Replacement of subcutaneous Jackson-Pratt drain.   DISCHARGE MEDICATIONS:  1. Vicodin 5/500 one p.o. q.4h. p.r.n. pain, #20 tablets, refill x1.  2. Motrin 800 mg every 8 hours p.r.n. cramps.  3. Hydrochlorothiazide 25 mg p.o. daily.  4. Prenatal vitamins daily.   Followup 3 days staple removal in our office.  Jackson-Pratt drain removed  prior to discharge.   HOSPITAL SUMMARY:  This 25 year old, multiparous female, gravida 2, para 1,  39+ weeks by menstrual criteria and 38 by ultrasound was admitted after  prenatal course followed in the [facility] and documented in the admitting  history.  She was admitted with weight 170.  Blood pressure 150/82 with  estimated fetal weight of 7-1/2 pounds.   HOSPITAL COURSE:  Notable for admission anemia with preoperative hemoglobin  of 9.7, hematocrit 28.7.  As per Dr. Marcos Eke' request and order, she  received 1 unit of packed cells preop.  Preop hemoglobin was 10.4,  hematocrit 30.6.  She received an uneventful c-section with postoperative  hemoglobin of 9.3, hematocrit 27.3.  Rechecked  anemia  with hematocrit of 27.8 on the day of discharge.  She  tolerated a regular diet, had modest edema of the lower  abdomen  postoperatively, and will be treated with a diuretic as she is bottle  feeding.  Her contraception plans are Implanon after 4 weeks.      Tilda Burrow, M.D.  Electronically Signed     JVF/MEDQ  D:  12/29/2005  T:  12/31/2005  Job:  [id number]   cc:   [facility]

## 2010-08-04 NOTE — Op Note (Signed)
NAME:  Karen Rosales, Karen Rosales NO.:  [id number]   MEDICAL RECORD NO.:  [id number]          PATIENT TYPE:  INP   LOCATION:  A402                          FACILITY:  [facility]   PHYSICIAN:  Tilda Burrow, M.D. DATE OF BIRTH:  November 14, 1985   DATE OF PROCEDURE:  DATE OF DISCHARGE:                                 OPERATIVE REPORT   PREOPERATIVE DIAGNOSIS:  Pregnancy at 39 weeks, repeat Cesarean section.   POSTOPERATIVE DIAGNOSIS:  Pregnancy at 39 weeks, repeat Cesarean section.   PROCEDURE:  Repeat low-transverse cervical Cesarean section.   SURGEON:  Tilda Burrow, M.D.   ASSISTANTAmie Critchley, CST.   ANESTHESIA:  Spinal.   COMPLICATIONS:  None.   FINDINGS:  Healthy female, Apgars 9 and 9.   ESTIMATED BLOOD LOSS:  500 mL.   DRAINS:  Subcutaneous Jackson-Pratt drain placed.   DETAILS OF PROCEDURE:  The patient was taken to the operating room where  spinal anesthesia was introduced without difficulty despite the presence of  Harrington rods from prior back surgery.  Pfannenstiel incision was repeated  without difficulty and then peritoneal cavity entered in the midline.  Bladder flap was developed.  Transverse uterine incision performed and  extended laterally using index finger traction and vacuum assistance used to  guide the vertex through the incision.  The healthy female with Apgars 9 and  9 was cared for by Dr. Milford Cage.  See his notes for details.   Cord blood samples were obtained.  The placenta delivered using _Crede'__  uterine massage and antibiotic irrigation of the uterus followed by running  locking closure of the uterus with 0-Chromic.  The peritoneum was closed  over the bladder flap using interrupted 2-0 chromic sutures.  The abdomen  was irrigated further.  Anterior peritoneum closed with 2-0 chromic.  The  fascia closed with running 0-Vicryl.  The subcutaneous tissues were  approximated with interrupted 2-0 plain closure of the subcu fatty space,  and  then staple closure of the skin with  staples.  Jackson-Pratt drain was placed in the subcu space, sutured in  place with the right corner of the incision and placed to bulb suction.  The  patient went to the recovery room in stable condition.  Estimated blood loss  was 500 mL.      Tilda Burrow, M.D.  Electronically Signed     JVF/MEDQ  D:  12/27/2005  T:  12/28/2005  Job:  [id number]   cc:   Francoise Schaumann. Milford Cage DO, FAAP  Fax: [phone]

## 2010-12-14 LAB — URINALYSIS, ROUTINE W REFLEX MICROSCOPIC
Ketones, ur: 40 — AB
Urobilinogen, UA: 0.2

## 2010-12-14 LAB — URINE MICROSCOPIC-ADD ON

## 2010-12-25 LAB — URINALYSIS, ROUTINE W REFLEX MICROSCOPIC
Leukocytes, UA: NEGATIVE
Nitrite: NEGATIVE
Specific Gravity, Urine: >1.03 — ABNORMAL HIGH
Urobilinogen, UA: 1

## 2010-12-25 LAB — COMPREHENSIVE METABOLIC PANEL
Albumin: 4.1
Alkaline Phosphatase: 63
BUN: 6
CO2: 18 — ABNORMAL LOW
GFR calc non Af Amer: >60
Glucose, Bld: 91
Potassium: 3.1 — ABNORMAL LOW

## 2010-12-25 LAB — URINE MICROSCOPIC-ADD ON

## 2010-12-25 LAB — INFLUENZA A+B VIRUS AG-DIRECT(RAPID)
Inflenza A Ag: NEGATIVE
Influenza B Ag: POSITIVE — AB

## 2010-12-25 LAB — DIFFERENTIAL
Basophils Absolute: 0
Eosinophils Relative: 0
Lymphocytes Relative: 11 — ABNORMAL LOW
Monocytes Relative: 7

## 2010-12-25 LAB — CBC
HCT: 38.4
MCV: 86.6
RBC: 4.44
WBC: 6.9

## 2011-05-29 IMAGING — US US ABDOMEN COMPLETE
1 series · 14 of 25 positions shown · non-contrast
Comparison: None

CLINICAL DATA: Abdominal pain

COMPLETE ABDOMINAL ULTRASOUND:

[Series 1: us abdomen complete · 0.30mm/px · 14 of 66 slices shown]
[im 1/66]
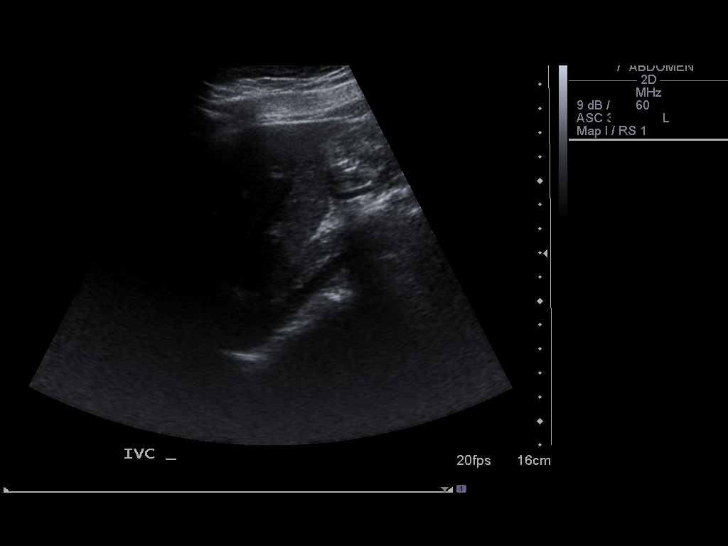
[im 6/66]
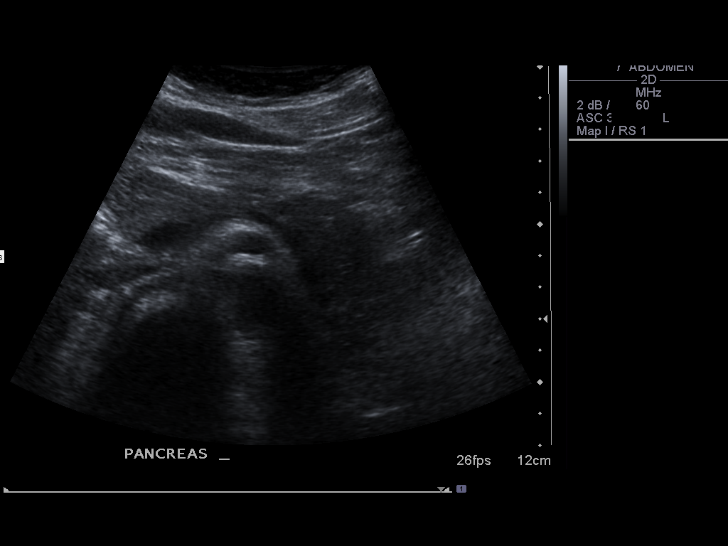
[im 11/66]
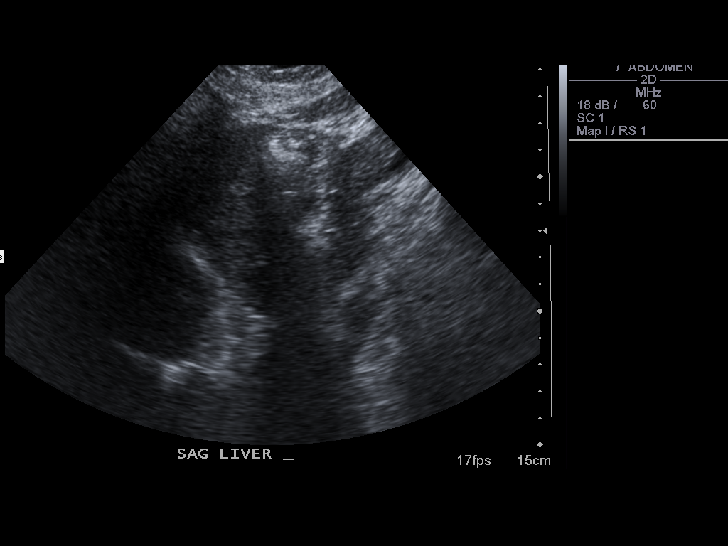
[im 17/66]
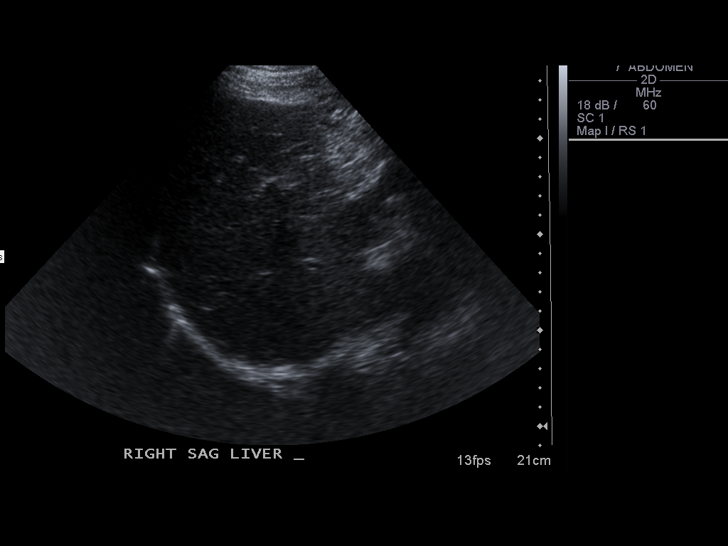
[im 22/66]
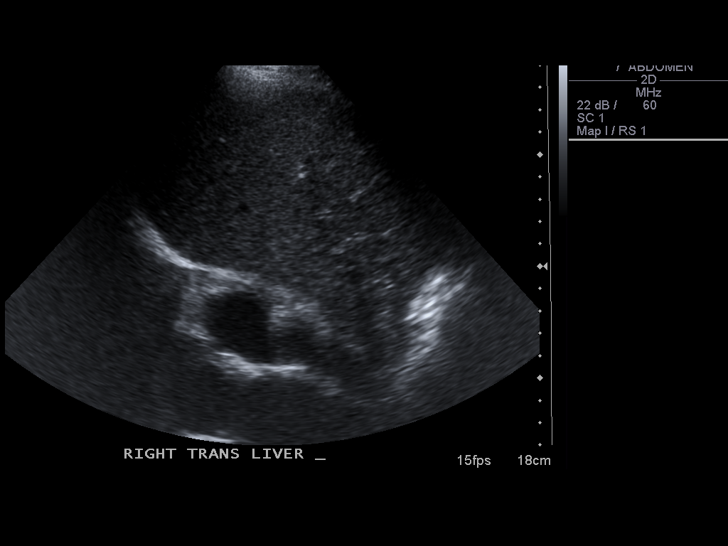
[im 25/66]
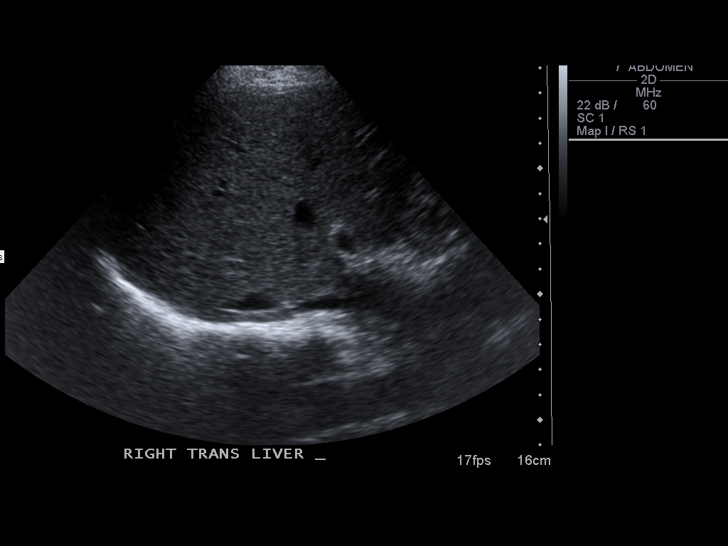
[im 30/66]
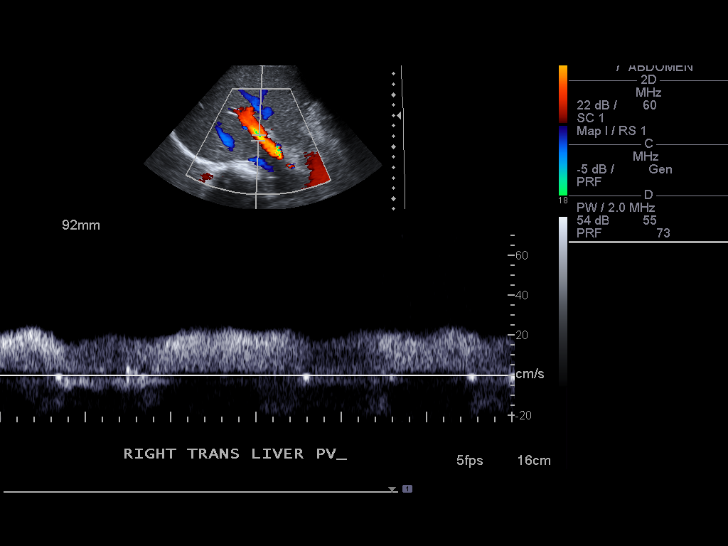
[im 36/66]
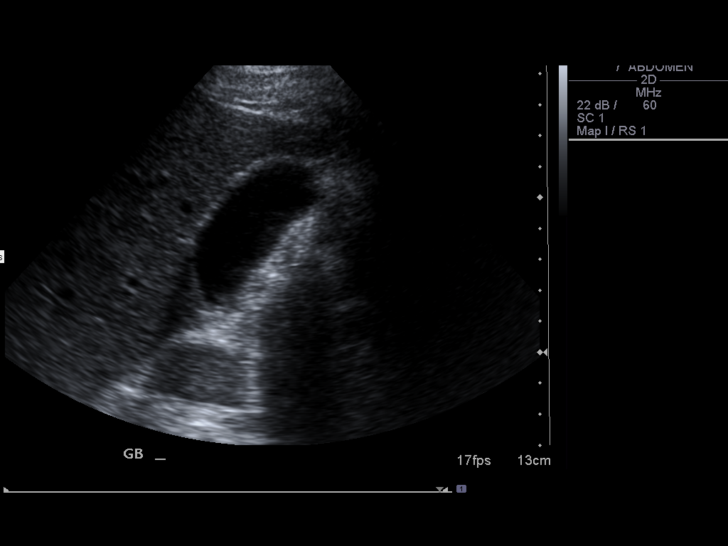
[im 41/66]
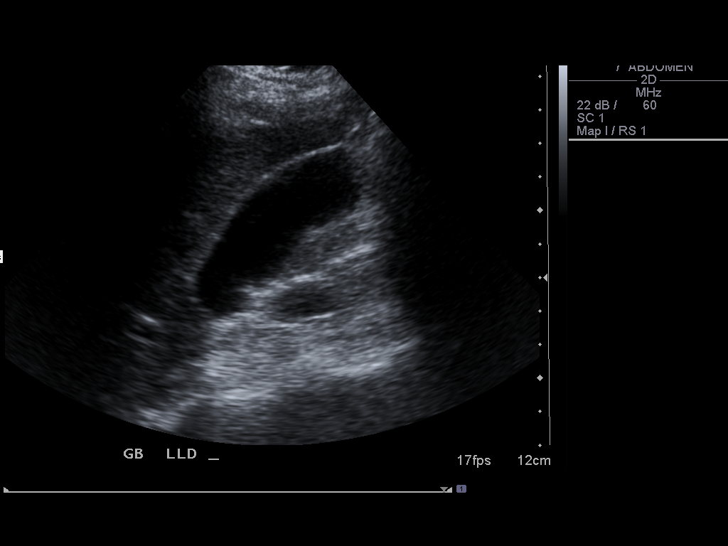
[im 44/66]
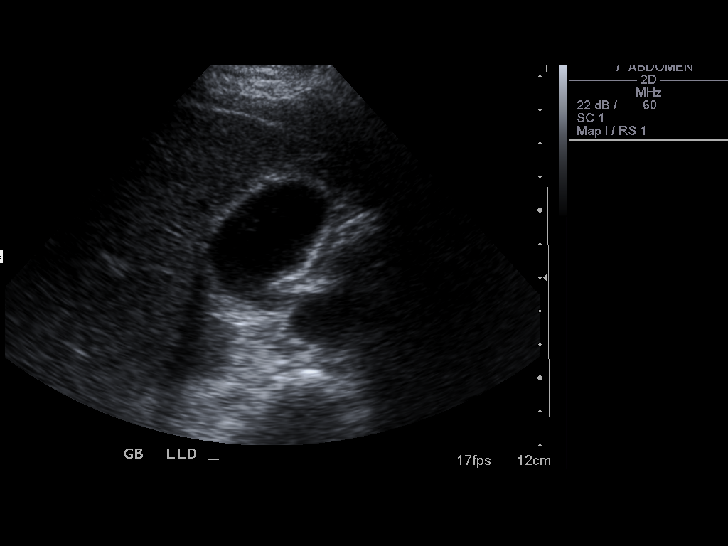
[im 49/66]
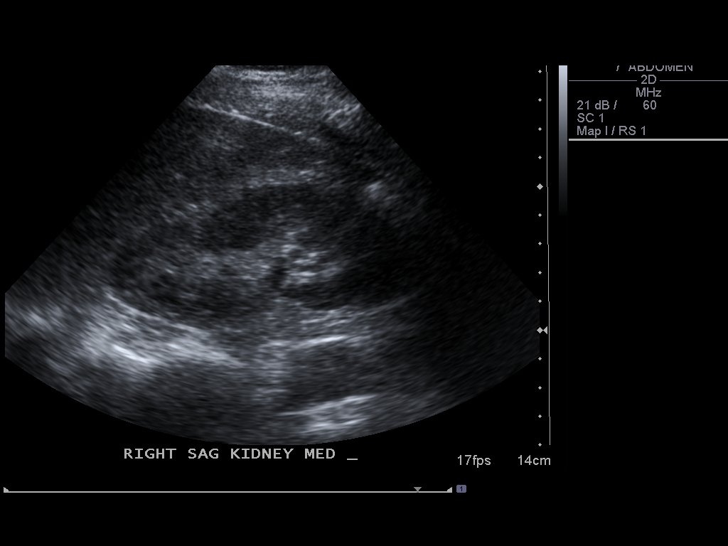
[im 55/66]
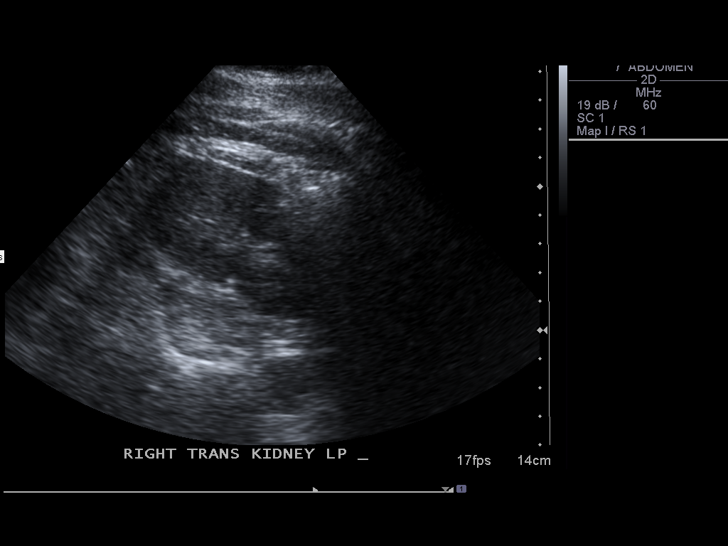
[im 60/66]
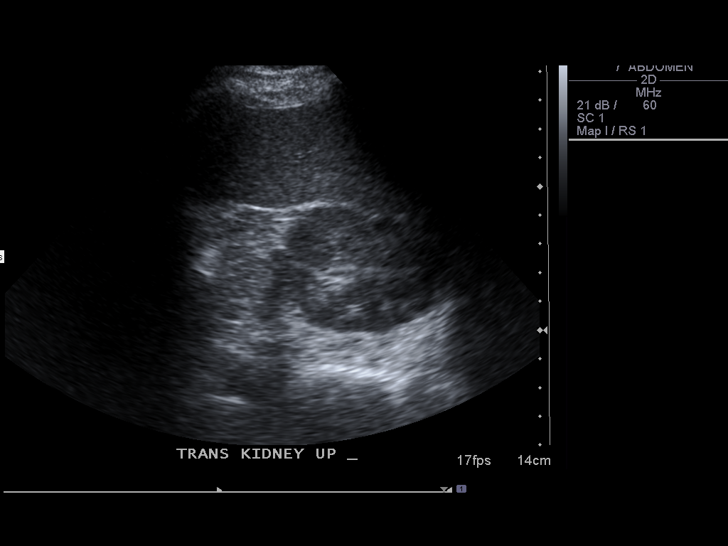
[im 66/66]
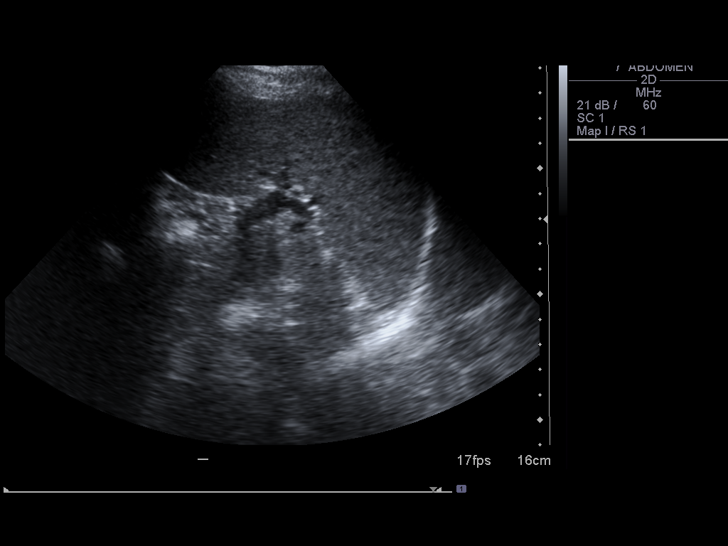

[14 of 25 positions shown; findings below may reference images not displayed]

Gallbladder:      Normally distended without stones or wall
            thickening
            No pericholecystic fluid or sonographic Murphy sign

Common bile duct:  2 mm, normal

Liver:  Normal appearance

IVC:  Unremarkable

Pancreas:  Normal appearance

Spleen:  Normal appearance, 9.8 cm length

Right Kidney:  Normal size and morphology, 11.8 cm length

Left Kidney:  Normal size and morphology, 12.0 cm length

Abdominal aorta:  Unremarkable

No ascites
IMPRESSION: Normal upper abdominal ultrasound.

## 2016-07-13 ENCOUNTER — Encounter: Payer: Self-pay | Admitting: Women's Health

## 2016-07-18 ENCOUNTER — Encounter: Payer: Self-pay | Admitting: Women's Health

## 2016-07-24 ENCOUNTER — Other Ambulatory Visit: Payer: Self-pay | Admitting: Obstetrics & Gynecology

## 2016-07-25 LAB — CYTOLOGY - PAP

## 2019-05-06 ENCOUNTER — Encounter: Payer: Self-pay | Admitting: Emergency Medicine

## 2019-05-06 ENCOUNTER — Ambulatory Visit
Admission: EM | Admit: 2019-05-06 | Discharge: 2019-05-06 | Disposition: A | Discharge status: Home / Self Care | Payer: Self-pay | Attending: Emergency Medicine | Admitting: Emergency Medicine

## 2019-05-06 ENCOUNTER — Other Ambulatory Visit: Payer: Self-pay

## 2019-05-06 DIAGNOSIS — A084 Viral intestinal infection, unspecified: Secondary | ICD-10-CM

## 2019-05-06 DIAGNOSIS — Z20822 Contact with and (suspected) exposure to covid-19: Secondary | ICD-10-CM

## 2019-05-06 LAB — POC SARS CORONAVIRUS 2 AG -  ED: SARS Coronavirus 2 Ag: NEGATIVE

## 2019-05-06 MED ORDER — ONDANSETRON HCL 4 MG PO TABS: 4.0000 mg | ORAL_TABLET | Freq: Three times a day (TID) | ORAL | 0 refills | Status: DC | PRN

## 2019-05-06 NOTE — ED Triage Notes (Signed)
Pt presents with c/o nausea vomiting and diarrhea that began Tuesday night, pt also has developed a sore throat

## 2019-05-06 NOTE — ED Provider Notes (Addendum)
[facility]    CSN: [id number] Arrival date & time: 05/06/19  0818      History   Chief Complaint Chief Complaint  Patient presents with  . Emesis  . Sore Throat    HPI Karen Rosales is a 34 y.o. female.   Who presented to the urgent care with a complaint of nausea, vomiting, and diarrhea for the past 2 days.  Report  she has recently developed a sore throat.  Denies sick exposure to COVID, flu or strep.  Denies recent travel.  Denies aggravating or alleviating symptoms.  Denies previous COVID infection.   Denies fever, chills, fatigue, nasal congestion, rhinorrhea,  cough, SOB, wheezing, chest pain,changes in bowel or bladder habits.    The history is provided by the patient. No language interpreter was used.  Emesis Associated symptoms: diarrhea and sore throat   Sore Throat    History reviewed. No pertinent past medical history.  Patient Active Problem List   Diagnosis Date Noted  . BACK PAIN, LOW 05/16/2006  . SCOLIOSIS 05/16/2006    Past Surgical History:  Procedure Laterality Date  . BACK SURGERY    . CESAREAN SECTION      OB History   No obstetric history on file.      Home Medications    Prior to Admission medications   Not on File    Family History Family History  Problem Relation Age of Onset  . Healthy Mother   . Healthy Father     Social History Social History   Tobacco Use  . Smoking status: Never Smoker  . Smokeless tobacco: Never Used  Substance Use Topics  . Alcohol use: Not Currently  . Drug use: Never     Allergies   Patient has no known allergies.   Review of Systems Review of Systems  Constitutional: Negative.   HENT: Positive for sore throat.   Respiratory: Negative.   Cardiovascular: Negative.   Gastrointestinal: Positive for diarrhea, nausea and vomiting.  Neurological: Negative.      Physical Exam Triage Vital Signs ED Triage Vitals  Enc Vitals Group     BP      Pulse      Resp       Temp      Temp src      SpO2      Weight      Height      Head Circumference      Peak Flow      Pain Score      Pain Loc      Pain Edu?      Excl. in GC?    No data found.  Updated Vital Signs BP 135/87   Pulse (!) 120   Temp 99.7 F (37.6 C)   Resp 20   LMP 04/28/2019   SpO2 97%   Visual Acuity Right Eye Distance:   Left Eye Distance:   Bilateral Distance:    Right Eye Near:   Left Eye Near:    Bilateral Near:     Physical Exam Vitals and nursing note reviewed.  Constitutional:      General: She is not in acute distress.    Appearance: Normal appearance. She is normal weight. She is not ill-appearing or toxic-appearing.  HENT:     Head: Normocephalic.     Right Ear: Tympanic membrane, ear canal and external ear normal. There is no impacted cerumen.     Left Ear: Tympanic membrane, ear  canal and external ear normal. There is no impacted cerumen.     Nose: Nose normal. No congestion.     Mouth/Throat:     Mouth: Mucous membranes are moist.     Pharynx: Oropharynx is clear. No oropharyngeal exudate or posterior oropharyngeal erythema.  Cardiovascular:     Rate and Rhythm: Normal rate and regular rhythm.     Pulses: Normal pulses.     Heart sounds: Normal heart sounds. No murmur.  Pulmonary:     Effort: Pulmonary effort is normal. No respiratory distress.     Breath sounds: Normal breath sounds. No wheezing or rhonchi.  Chest:     Chest wall: No tenderness.  Abdominal:     General: Abdomen is flat. Bowel sounds are normal. There is no distension.     Palpations: There is no mass.     Tenderness: There is no abdominal tenderness.  Skin:    Capillary Refill: Capillary refill takes less than 2 seconds.  Neurological:     General: No focal deficit present.     Mental Status: She is alert and oriented to person, place, and time.      UC Treatments / Results  Labs (all labs ordered are listed, but only abnormal results are displayed) Labs Reviewed  POC  SARS CORONAVIRUS 2 AG -  ED    EKG   Radiology No results found.  Procedures Procedures (including critical care time)  Medications Ordered in UC Medications - No data to display  Initial Impression / Assessment and Plan / UC Course  I have reviewed the triage vital signs and the nursing notes.  Pertinent labs & imaging results that were available during my care of the patient were reviewed by me and considered in my medical decision making (see chart for details).   POCT COVID-19 test was negative [facility] COVID-19 test was ordered Zofran was prescribed Advised patient to go to ED for worsening of symptoms   Final Clinical Impressions(s) / UC Diagnoses   Final diagnoses:  Viral gastroenteritis  COVID-19 ruled out     Discharge Instructions     COVID testing ordered.  It will take between 2-7 days for test results.  Someone will contact you regarding abnormal results.    In the meantime: You should remain isolated in your home for 10 days from symptom onset AND greater than 72 hours after symptoms resolution (absence of fever without the use of fever-reducing medication and improvement in respiratory symptoms), whichever is longer Get plenty of rest and push fluids Use medications daily for symptom relief Use OTC medications like ibuprofen or tylenol as needed fever or pain Call or go to the ED if you have any new or worsening symptoms such as fever, worsening cough, shortness of breath, chest tightness, chest pain, turning blue, changes in mental status, etc...     ED Prescriptions    None     PDMP not reviewed this encounter.   Emerson Monte, FNP 05/06/19 0900    Emerson Monte, FNP 05/06/19 0900

## 2019-05-06 NOTE — Discharge Instructions (Addendum)
POCT COVID-19 test was negative COVID testing ordered.  It will take between 2-7 days for test results.  Someone will contact you regarding abnormal results.    In the meantime: You should remain isolated in your home for 10 days from symptom onset AND greater than 72 hours after symptoms resolution (absence of fever without the use of fever-reducing medication and improvement in respiratory symptoms), whichever is longer Get plenty of rest and push fluids Use medications daily for symptom relief Use OTC medications like ibuprofen or tylenol as needed fever or pain Call or go to the ED if you have any new or worsening symptoms such as fever, worsening cough, shortness of breath, chest tightness, chest pain, turning blue, changes in mental status, etc...  

## 2019-05-08 LAB — NOVEL CORONAVIRUS, NAA: SARS-CoV-2, NAA: NOT DETECTED

## 2019-09-23 ENCOUNTER — Ambulatory Visit: Payer: Self-pay | Admitting: Internal Medicine

## 2019-10-23 ENCOUNTER — Other Ambulatory Visit: Payer: Self-pay

## 2019-10-23 ENCOUNTER — Ambulatory Visit (INDEPENDENT_AMBULATORY_CARE_PROVIDER_SITE_OTHER): Payer: Self-pay | Admitting: Internal Medicine

## 2019-10-23 ENCOUNTER — Encounter: Payer: Self-pay | Admitting: Internal Medicine

## 2019-10-23 VITALS — BP 142/82 | HR 42 | Temp 98.4°F | Ht 60.75 in | Wt 170.3 lb

## 2019-10-23 DIAGNOSIS — R0609 Other forms of dyspnea: Secondary | ICD-10-CM

## 2019-10-23 DIAGNOSIS — R0600 Dyspnea, unspecified: Secondary | ICD-10-CM

## 2019-10-23 DIAGNOSIS — R030 Elevated blood-pressure reading, without diagnosis of hypertension: Secondary | ICD-10-CM

## 2019-10-23 DIAGNOSIS — E669 Obesity, unspecified: Secondary | ICD-10-CM | POA: Insufficient documentation

## 2019-10-23 DIAGNOSIS — Z0000 Encounter for general adult medical examination without abnormal findings: Secondary | ICD-10-CM

## 2019-10-23 DIAGNOSIS — R011 Cardiac murmur, unspecified: Secondary | ICD-10-CM

## 2019-10-23 DIAGNOSIS — R5383 Other fatigue: Secondary | ICD-10-CM

## 2019-10-23 NOTE — Progress Notes (Signed)
New Patient Office Visit     This visit occurred during the SARS-CoV-2 public health emergency.  Safety protocols were in place, including screening questions prior to the visit, additional usage of staff PPE, and extensive cleaning of exam room while observing appropriate contact time as indicated for disinfecting solutions.    CC/Reason for Visit: Establish care, annual preventive exam, discuss acute conditions Previous PCP: None Last Visit: Unknown  HPI: Karen Rosales is a 34 y.o. female who is coming in today for the above mentioned reasons. Past Medical History is significant for: Only mild obesity with a BMI of 32.  She has routine dental care but not eye care.  She has not had her Covid or Tdap vaccines.  She is a stay-at-home mother, she does not smoke, she does not drink, she has no known drug allergies, does not take any medications other than occasional ibuprofen.  Her family history is only significant for a maternal grandmother with an unknown cancer.  Past surgical history significant for back surgery due to history of scoliosis, C-section x2 and a tubal ligation.  She would like to address elevated blood pressure numbers that she has seen recently especially when she visits the dentist office.  She has also been complaining of excessive fatigue and dyspnea on exertion while walking or sometimes even talking.  She denies any chest pains.   Past Medical/Surgical History: Past Medical History:  Diagnosis Date  . Obesity (BMI 30.0-34.9)     Past Surgical History:  Procedure Laterality Date  . BACK SURGERY    . CESAREAN SECTION      Social History:  reports that she has never smoked. She has never used smokeless tobacco. She reports previous alcohol use. She reports that she does not use drugs.  Allergies: No Known Allergies  Family History:  Family History  Problem Relation Age of Onset  . Healthy Mother   . Healthy Father   . Cancer Maternal Grandmother       Current Outpatient Medications:  .  ibuprofen (ADVIL) 200 MG tablet, Take 800 mg by mouth as needed., Disp: , Rfl:  .  ondansetron (ZOFRAN) 4 MG tablet, Take 1 tablet (4 mg total) by mouth every 8 (eight) hours as needed for nausea or vomiting. (Patient not taking: Reported on 10/23/2019), Disp: 20 tablet, Rfl: 0  Review of Systems:  Constitutional: Denies fever, chills, diaphoresis, appetite change. HEENT: Denies photophobia, eye pain, redness, hearing loss, ear pain, congestion, sore throat, rhinorrhea, sneezing, mouth sores, trouble swallowing, neck pain, neck stiffness and tinnitus.   Respiratory: Denies cough, chest tightness,  and wheezing.   Cardiovascular: Denies chest pain, palpitations and leg swelling.  Gastrointestinal: Denies nausea, vomiting, abdominal pain, diarrhea, constipation, blood in stool and abdominal distention.  Genitourinary: Denies dysuria, urgency, frequency, hematuria, flank pain and difficulty urinating.  Endocrine: Denies: hot or cold intolerance, sweats, changes in hair or nails, polyuria, polydipsia. Musculoskeletal: Denies myalgias, back pain, joint swelling, arthralgias and gait problem.  Skin: Denies pallor, rash and wound.  Neurological: Denies dizziness, seizures, syncope, weakness, light-headedness, numbness and headaches.  Hematological: Denies adenopathy. Easy bruising, personal or family bleeding history  Psychiatric/Behavioral: Denies suicidal ideation, mood changes, confusion, nervousness, sleep disturbance and agitation    Physical Exam: Vitals:   10/23/19 0703  BP: (!) 142/82  Pulse: (!) 42  Temp: 98.4 F (36.9 C)  TempSrc: Temporal  SpO2: 94%  Weight: 170 lb 4.8 oz (77.2 kg)  Height: 5' 0.75" (1.543 m)  Body mass index is 32.44 kg/m.   Constitutional: NAD, calm, comfortable Eyes: PERRL, lids and conjunctivae normal ENMT: Mucous membranes are moist.  Tympanic membrane is pearly white, no erythema or bulging. Neck: normal,  supple, no masses, no thyromegaly Respiratory: clear to auscultation bilaterally, no wheezing, no crackles. Normal respiratory effort. No accessory muscle use.  Cardiovascular: Regular rate and rhythm, systolic ejection murmur is present, no rubs / gallops. No extremity edema. 2+ pedal pulses. Abdomen: no tenderness, no masses palpated. No hepatosplenomegaly. Bowel sounds positive.  Musculoskeletal: no clubbing / cyanosis. No joint deformity upper and lower extremities. Good ROM, no contractures. Normal muscle tone.  Skin: no rashes, lesions, ulcers. No induration Neurologic: CN 2-12 grossly intact. Sensation intact, DTR normal. Strength 5/5 in all 4.  Psychiatric: Normal judgment and insight. Alert and oriented x 3. Normal mood.    Impression and Plan:  Encounter for preventive health examination  -Advised routine eye care, she has dental care. -She is due for Tdap and Covid vaccines, she will reconsider but declines today. -Labs today. -Healthy lifestyle discussed in detail. -Commence routine colon cancer age 81 and breast cancer age 59. -She had a Pap smear with her GYN in 2019.  Fatigue, unspecified type DOE (dyspnea on exertion)  -Check CBC, vitamin D, vitamin B12, TSH. -Also wonder how her significant systolic murmur plays into this.  Echocardiogram has been requested.  Systolic murmur  - Plan: ECHOCARDIOGRAM COMPLETE  Elevated BP without diagnosis of hypertension -She will do ambulatory blood pressure monitoring and return in 6 to 8 weeks for follow-up.  Obesity (BMI 30.0-34.9) -Discussed healthy lifestyle, including increased physical activity and better food choices to promote weight loss.     Patient Instructions  -Nice seeing you today!!  -Lab work today; will notify you once results are available.  -Will order echocardiogram today.  -Schedule follow up in 6-8 weeks for your blood pressure. Please bring in BP chart that day.   Preventive Care 21-43 Years Old,  Female Preventive care refers to visits with your health care provider and lifestyle choices that can promote health and wellness. This includes:  A yearly physical exam. This may also be called an annual well check.  Regular dental visits and eye exams.  Immunizations.  Screening for certain conditions.  Healthy lifestyle choices, such as eating a healthy diet, getting regular exercise, not using drugs or products that contain nicotine and tobacco, and limiting alcohol use. What can I expect for my preventive care visit? Physical exam Your health care provider will check your:  Height and weight. This may be used to calculate body mass index (BMI), which tells if you are at a healthy weight.  Heart rate and blood pressure.  Skin for abnormal spots. Counseling Your health care provider may ask you questions about your:  Alcohol, tobacco, and drug use.  Emotional well-being.  Home and relationship well-being.  Sexual activity.  Eating habits.  Work and work Statistician.  Method of birth control.  Menstrual cycle.  Pregnancy history. What immunizations do I need?  Influenza (flu) vaccine  This is recommended every year. Tetanus, diphtheria, and pertussis (Tdap) vaccine  You may need a Td booster every 10 years. Varicella (chickenpox) vaccine  You may need this if you have not been vaccinated. Human papillomavirus (HPV) vaccine  If recommended by your health care provider, you may need three doses over 6 months. Measles, mumps, and rubella (MMR) vaccine  You may need at least one dose of MMR. You may  also need a second dose. Meningococcal conjugate (MenACWY) vaccine  One dose is recommended if you are age 23-21 years and a first-year college student living in a residence hall, or if you have one of several medical conditions. You may also need additional booster doses. Pneumococcal conjugate (PCV13) vaccine  You may need this if you have certain conditions  and were not previously vaccinated. Pneumococcal polysaccharide (PPSV23) vaccine  You may need one or two doses if you smoke cigarettes or if you have certain conditions. Hepatitis A vaccine  You may need this if you have certain conditions or if you travel or work in places where you may be exposed to hepatitis A. Hepatitis B vaccine  You may need this if you have certain conditions or if you travel or work in places where you may be exposed to hepatitis B. Haemophilus influenzae type b (Hib) vaccine  You may need this if you have certain conditions. You may receive vaccines as individual doses or as more than one vaccine together in one shot (combination vaccines). Talk with your health care provider about the risks and benefits of combination vaccines. What tests do I need?  Blood tests  Lipid and cholesterol levels. These may be checked every 5 years starting at age 27.  Hepatitis C test.  Hepatitis B test. Screening  Diabetes screening. This is done by checking your blood sugar (glucose) after you have not eaten for a while (fasting).  Sexually transmitted disease (STD) testing.  BRCA-related cancer screening. This may be done if you have a family history of breast, ovarian, tubal, or peritoneal cancers.  Pelvic exam and Pap test. This may be done every 3 years starting at age 55. Starting at age 20, this may be done every 5 years if you have a Pap test in combination with an HPV test. Talk with your health care provider about your test results, treatment options, and if necessary, the need for more tests. Follow these instructions at home: Eating and drinking   Eat a diet that includes fresh fruits and vegetables, whole grains, lean protein, and low-fat dairy.  Take vitamin and mineral supplements as recommended by your health care provider.  Do not drink alcohol if: ? Your health care provider tells you not to drink. ? You are pregnant, may be pregnant, or are  planning to become pregnant.  If you drink alcohol: ? Limit how much you have to 0-1 drink a day. ? Be aware of how much alcohol is in your drink. In the [place] one drink equals one 12 oz bottle of beer (355 mL), one 5 oz glass of wine (148 mL), or one 1 oz glass of hard liquor (44 mL). Lifestyle  Take daily care of your teeth and gums.  Stay active. Exercise for at least 30 minutes on 5 or more days each week.  Do not use any products that contain nicotine or tobacco, such as cigarettes, e-cigarettes, and chewing tobacco. If you need help quitting, ask your health care provider.  If you are sexually active, practice safe sex. Use a condom or other form of birth control (contraception) in order to prevent pregnancy and STIs (sexually transmitted infections). If you plan to become pregnant, see your health care provider for a preconception visit. What's next?  Visit your health care provider once a year for a well check visit.  Ask your health care provider how often you should have your eyes and teeth checked.  Stay up to date on  all vaccines. This information is not intended to replace advice given to you by your health care provider. Make sure you discuss any questions you have with your health care provider. Document Revised: 11/14/2017 Document Reviewed: 11/14/2017 Elsevier Patient Education  2020 [place], [place] [facility]

## 2019-10-23 NOTE — Patient Instructions (Signed)
-Nice seeing you today!!  -Lab work today; will notify you once results are available.  -Will order echocardiogram today.  -Schedule follow up in 6-8 weeks for your blood pressure. Please bring in BP chart that day.   Preventive Care 72-36 Years Old, Female Preventive care refers to visits with your health care provider and lifestyle choices that can promote health and wellness. This includes:  A yearly physical exam. This may also be called an annual well check.  Regular dental visits and eye exams.  Immunizations.  Screening for certain conditions.  Healthy lifestyle choices, such as eating a healthy diet, getting regular exercise, not using drugs or products that contain nicotine and tobacco, and limiting alcohol use. What can I expect for my preventive care visit? Physical exam Your health care provider will check your:  Height and weight. This may be used to calculate body mass index (BMI), which tells if you are at a healthy weight.  Heart rate and blood pressure.  Skin for abnormal spots. Counseling Your health care provider may ask you questions about your:  Alcohol, tobacco, and drug use.  Emotional well-being.  Home and relationship well-being.  Sexual activity.  Eating habits.  Work and work Statistician.  Method of birth control.  Menstrual cycle.  Pregnancy history. What immunizations do I need?  Influenza (flu) vaccine  This is recommended every year. Tetanus, diphtheria, and pertussis (Tdap) vaccine  You may need a Td booster every 10 years. Varicella (chickenpox) vaccine  You may need this if you have not been vaccinated. Human papillomavirus (HPV) vaccine  If recommended by your health care provider, you may need three doses over 6 months. Measles, mumps, and rubella (MMR) vaccine  You may need at least one dose of MMR. You may also need a second dose. Meningococcal conjugate (MenACWY) vaccine  One dose is recommended if you are age  60-21 years and a first-year college student living in a residence hall, or if you have one of several medical conditions. You may also need additional booster doses. Pneumococcal conjugate (PCV13) vaccine  You may need this if you have certain conditions and were not previously vaccinated. Pneumococcal polysaccharide (PPSV23) vaccine  You may need one or two doses if you smoke cigarettes or if you have certain conditions. Hepatitis A vaccine  You may need this if you have certain conditions or if you travel or work in places where you may be exposed to hepatitis A. Hepatitis B vaccine  You may need this if you have certain conditions or if you travel or work in places where you may be exposed to hepatitis B. Haemophilus influenzae type b (Hib) vaccine  You may need this if you have certain conditions. You may receive vaccines as individual doses or as more than one vaccine together in one shot (combination vaccines). Talk with your health care provider about the risks and benefits of combination vaccines. What tests do I need?  Blood tests  Lipid and cholesterol levels. These may be checked every 5 years starting at age 73.  Hepatitis C test.  Hepatitis B test. Screening  Diabetes screening. This is done by checking your blood sugar (glucose) after you have not eaten for a while (fasting).  Sexually transmitted disease (STD) testing.  BRCA-related cancer screening. This may be done if you have a family history of breast, ovarian, tubal, or peritoneal cancers.  Pelvic exam and Pap test. This may be done every 3 years starting at age 69. Starting at age  30, this may be done every 5 years if you have a Pap test in combination with an HPV test. Talk with your health care provider about your test results, treatment options, and if necessary, the need for more tests. Follow these instructions at home: Eating and drinking   Eat a diet that includes fresh fruits and vegetables, whole  grains, lean protein, and low-fat dairy.  Take vitamin and mineral supplements as recommended by your health care provider.  Do not drink alcohol if: ? Your health care provider tells you not to drink. ? You are pregnant, may be pregnant, or are planning to become pregnant.  If you drink alcohol: ? Limit how much you have to 0-1 drink a day. ? Be aware of how much alcohol is in your drink. In the [place] one drink equals one 12 oz bottle of beer (355 mL), one 5 oz glass of wine (148 mL), or one 1 oz glass of hard liquor (44 mL). Lifestyle  Take daily care of your teeth and gums.  Stay active. Exercise for at least 30 minutes on 5 or more days each week.  Do not use any products that contain nicotine or tobacco, such as cigarettes, e-cigarettes, and chewing tobacco. If you need help quitting, ask your health care provider.  If you are sexually active, practice safe sex. Use a condom or other form of birth control (contraception) in order to prevent pregnancy and STIs (sexually transmitted infections). If you plan to become pregnant, see your health care provider for a preconception visit. What's next?  Visit your health care provider once a year for a well check visit.  Ask your health care provider how often you should have your eyes and teeth checked.  Stay up to date on all vaccines. This information is not intended to replace advice given to you by your health care provider. Make sure you discuss any questions you have with your health care provider. Document Revised: 11/14/2017 Document Reviewed: 11/14/2017 Elsevier Patient Education  2020 [employer].

## 2019-10-23 NOTE — Addendum Note (Signed)
Addended by: Lerry Liner on: 10/23/2019 07:37 AM   Modules accepted: Orders

## 2019-10-24 LAB — COMPREHENSIVE METABOLIC PANEL
AG Ratio: 1.8 (calc) (ref 1.0–2.5)
ALT: 9 U/L (ref 6–29)
AST: 11 U/L (ref 10–30)
Albumin: 4.2 g/dL (ref 3.6–5.1)
Alkaline phosphatase (APISO): 50 U/L (ref 31–125)
BUN: 7 mg/dL (ref 7–25)
CO2: 22 mmol/L (ref 20–32)
Calcium: 9 mg/dL (ref 8.6–10.2)
Chloride: 104 mmol/L (ref 98–110)
Creat: 0.56 mg/dL (ref 0.50–1.10)
Globulin: 2.4 g/dL (calc) (ref 1.9–3.7)
Glucose, Bld: 92 mg/dL (ref 65–99)
Potassium: 3.8 mmol/L (ref 3.5–5.3)
Sodium: 138 mmol/L (ref 135–146)
Total Bilirubin: 0.3 mg/dL (ref 0.2–1.2)
Total Protein: 6.6 g/dL (ref 6.1–8.1)

## 2019-10-24 LAB — CBC WITH DIFFERENTIAL/PLATELET
Absolute Monocytes: 420 cells/uL (ref 200–950)
Basophils Absolute: 62 cells/uL (ref 0–200)
Basophils Relative: 1.1 %
Eosinophils Absolute: 90 cells/uL (ref 15–500)
Eosinophils Relative: 1.6 %
HCT: 26.2 % — ABNORMAL LOW (ref 35.0–45.0)
Hemoglobin: 7.4 g/dL — ABNORMAL LOW (ref 11.7–15.5)
Lymphs Abs: 1534 cells/uL (ref 850–3900)
MCH: 18 pg — ABNORMAL LOW (ref 27.0–33.0)
MCHC: 28.2 g/dL — ABNORMAL LOW (ref 32.0–36.0)
MCV: 63.9 fL — ABNORMAL LOW (ref 80.0–100.0)
MPV: 10.5 fL (ref 7.5–12.5)
Monocytes Relative: 7.5 %
Neutro Abs: 3494 cells/uL (ref 1500–7800)
Neutrophils Relative %: 62.4 %
Platelets: 377 10*3/uL (ref 140–400)
RBC: 4.1 10*6/uL (ref 3.80–5.10)
RDW: 17.2 % — ABNORMAL HIGH (ref 11.0–15.0)
Total Lymphocyte: 27.4 %
WBC: 5.6 10*3/uL (ref 3.8–10.8)

## 2019-10-24 LAB — VITAMIN D 25 HYDROXY (VIT D DEFICIENCY, FRACTURES): Vit D, 25-Hydroxy: 18 ng/mL — ABNORMAL LOW (ref 30–100)

## 2019-10-24 LAB — HEMOGLOBIN A1C
Hgb A1c MFr Bld: 5.1 % of total Hgb (ref <5.7)
Mean Plasma Glucose: 100 (calc)
eAG (mmol/L): 5.5 (calc)

## 2019-10-24 LAB — TSH: TSH: 1.84 mIU/L

## 2019-10-24 LAB — LIPID PANEL
Cholesterol: 182 mg/dL (ref <200)
HDL: 60 mg/dL (ref >=50)
LDL Cholesterol (Calc): 104 mg/dL (calc) — ABNORMAL HIGH
Non-HDL Cholesterol (Calc): 122 mg/dL (calc) (ref <130)
Total CHOL/HDL Ratio: 3 (calc) (ref <5.0)
Triglycerides: 87 mg/dL (ref <150)

## 2019-10-24 LAB — VITAMIN B12: Vitamin B-12: 388 pg/mL (ref 200–1100)

## 2019-10-27 ENCOUNTER — Other Ambulatory Visit: Payer: Self-pay | Admitting: Internal Medicine

## 2019-10-27 DIAGNOSIS — D509 Iron deficiency anemia, unspecified: Secondary | ICD-10-CM

## 2019-10-27 DIAGNOSIS — E559 Vitamin D deficiency, unspecified: Secondary | ICD-10-CM

## 2019-10-27 MED ORDER — VITAMIN D (ERGOCALCIFEROL) 1.25 MG (50000 UNIT) PO CAPS: 50000.0000 [IU] | ORAL_CAPSULE | ORAL | 0 refills | Status: AC

## 2019-10-28 ENCOUNTER — Other Ambulatory Visit: Payer: Self-pay | Admitting: Internal Medicine

## 2019-10-28 MED ORDER — FERROUS SULFATE 325 (65 FE) MG PO TBEC: 325.0000 mg | DELAYED_RELEASE_TABLET | Freq: Three times a day (TID) | ORAL | 3 refills | Status: DC

## 2019-11-10 ENCOUNTER — Encounter (HOSPITAL_COMMUNITY): Payer: Self-pay

## 2019-11-11 ENCOUNTER — Ambulatory Visit (HOSPITAL_COMMUNITY): Payer: Self-pay | Attending: Cardiology

## 2019-11-11 ENCOUNTER — Other Ambulatory Visit: Payer: Self-pay

## 2019-11-11 DIAGNOSIS — R0600 Dyspnea, unspecified: Secondary | ICD-10-CM

## 2019-11-11 DIAGNOSIS — R0609 Other forms of dyspnea: Secondary | ICD-10-CM

## 2019-11-11 DIAGNOSIS — R5383 Other fatigue: Secondary | ICD-10-CM

## 2019-11-11 DIAGNOSIS — R011 Cardiac murmur, unspecified: Secondary | ICD-10-CM

## 2019-11-11 LAB — ECHOCARDIOGRAM COMPLETE
Area-P 1/2: 2.83 cm2
S' Lateral: 3.3 cm

## 2019-11-20 ENCOUNTER — Other Ambulatory Visit: Payer: Self-pay | Admitting: Internal Medicine

## 2019-11-20 ENCOUNTER — Telehealth: Payer: Self-pay | Admitting: Internal Medicine

## 2019-11-20 DIAGNOSIS — R931 Abnormal findings on diagnostic imaging of heart and coronary circulation: Secondary | ICD-10-CM

## 2019-11-20 NOTE — Telephone Encounter (Signed)
Spoke with patient. See lab result note. 

## 2019-11-20 NOTE — Telephone Encounter (Signed)
Karen Rosales received a call from Rachel--needs a call back at [phone]

## 2019-12-03 ENCOUNTER — Ambulatory Visit: Payer: Self-pay | Admitting: Internal Medicine

## 2019-12-09 ENCOUNTER — Encounter (HOSPITAL_COMMUNITY): Payer: Self-pay

## 2019-12-14 NOTE — Progress Notes (Signed)
Cardiology Office Note:    Date:  12/16/2019   ID:  Karen Rosales [birth year], MRN [id number]  PCP:  Philip Aspen, Limmie Patricia, MD  [facility] Cardiologist:  No primary care provider on file.  [facility] Electrophysiologist:  None   Referring MD: Philip Aspen, Estel*    History of Present Illness:    Karen Rosales is a 34 y.o. female with a hx of obesity who was referred by Dr. Philip Aspen for evaluation of a murmur.  Patient was seen at her annual physical and was noted to have a murmur on examination. She noted some mild DOE and fatigue. TTE ordered and demonstrated normal LVEF 60-65%, moderately enlarged LA, mild-to-moderate AR. She is now referred to Cardiology for further management.  Today, the patient states that she feels well. Denies DOE, LE edema, PND, orthopnea. Has been monitoring blood pressure at home and it has been running mainly 110-130s/50-80s. Has isolated readings in the 140s. She states she has occasional 1/10 chest pain that occurs randomly and sometimes with exertion but resolves on its own. No associated SOB, nausea, vomiting, diaphoresis. She has not been as active as she would like but is motivated to start exercising. Denies any physical limitations. No lightheadedness, dizziness or syncope.   TC 182, HDL 60, LDL 104, A1C 5.1  Past Medical History:  Diagnosis Date   Obesity (BMI 30.0-34.9)     Past Surgical History:  Procedure Laterality Date   BACK SURGERY     CESAREAN SECTION      Current Medications: Current Meds  Medication Sig   ferrous sulfate 325 (65 FE) MG EC tablet Take 1 tablet (325 mg total) by mouth 3 (three) times daily with meals.   ibuprofen (ADVIL) 200 MG tablet Take 800 mg by mouth as needed.   Vitamin D, Ergocalciferol, (DRISDOL) 1.25 MG (50000 UNIT) CAPS capsule Take 1 capsule (50,000 Units total) by mouth every 7 (seven) days for 12 doses.     Allergies:   Patient has no known allergies.     Social History   Socioeconomic History   Marital status: Married    Spouse name: Not on file   Number of children: Not on file   Years of education: Not on file   Highest education level: Not on file  Occupational History   Not on file  Tobacco Use   Smoking status: Never Smoker   Smokeless tobacco: Never Used  Vaping Use   Vaping Use: Never used  Substance and Sexual Activity   Alcohol use: Not Currently   Drug use: Never   Sexual activity: Yes    Birth control/protection: Other-see comments    Comment: Tube  Other Topics Concern   Not on file  Social History Narrative   Not on file   Social Determinants of Health   Financial Resource Strain:    Difficulty of Paying Living Expenses: Not on file  Food Insecurity:    Worried About Running Out of Food in the Last Year: Not on file   Ran Out of Food in the Last Year: Not on file  Transportation Needs:    Lack of Transportation (Medical): Not on file   Lack of Transportation (Non-Medical): Not on file  Physical Activity:    Days of Exercise per Week: Not on file   Minutes of Exercise per Session: Not on file  Stress:    Feeling of Stress : Not on file  Social Connections:    Frequency of  Communication with Friends and Family: Not on file   Frequency of Social Gatherings with Friends and Family: Not on file   Attends Religious Services: Not on file   Active Member of Clubs or Organizations: Not on file   Attends Banker Meetings: Not on file   Marital Status: Not on file     Family History: The patient's family history includes Asthma in her son; Cancer in her maternal grandmother; Early death in her maternal grandmother; Healthy in her father and mother.  ROS:   Please see the history of present illness.    The patient denies chest pain, chest pressure, dyspnea at rest or with exertion, palpitations, PND, orthopnea, or leg swelling. Denies cough, fever, chills. Denies  nausea, vomiting. Denies syncope or presyncope. Denies dizziness or lightheadedness. Denies snoring.  EKGs/Labs/Other Studies Reviewed:    The following studies were reviewed today: TTE 11/17/2019: IMPRESSIONS  1. Left ventricular ejection fraction, by estimation, is 60 to 65%. The  left ventricle has normal function. The left ventricle has no regional  wall motion abnormalities. Left ventricular diastolic parameters are  indeterminate.  2. Right ventricular systolic function is normal. The right ventricular  size is normal.  3. Left atrial size was moderately dilated.  4. The mitral valve is normal in structure. No evidence of mitral valve  regurgitation. No evidence of mitral stenosis.  5. The aortic valve is tricuspid. Aortic valve regurgitation is mild to  moderate. Mild aortic valve sclerosis is present, with no evidence of  aortic valve stenosis.  6. The inferior vena cava is normal in size with greater than 50%  respiratory variability, suggesting right atrial pressure of 3 mmHg.   EKG:  EKG is  ordered today.  The ekg ordered today demonstrates SR, incomplete RBBB, HR 70  Recent Labs: 10/23/2019: ALT 9; BUN 7; Creat 0.56; Hemoglobin 7.4; Platelets 377; Potassium 3.8; Sodium 138; TSH 1.84  Recent Lipid Panel    Component Value Date/Time   CHOL 182 10/23/2019 0737   TRIG 87 10/23/2019 0737   HDL 60 10/23/2019 0737   CHOLHDL 3.0 10/23/2019 0737   LDLCALC 104 (H) 10/23/2019 0737    Physical Exam:    VS:  BP 110/60    Pulse 70    Ht 5' (1.524 m)    Wt 170 lb (77.1 kg)    SpO2 99%    BMI 33.20 kg/m     Wt Readings from Last 3 Encounters:  12/16/19 170 lb (77.1 kg)  10/23/19 170 lb 4.8 oz (77.2 kg)     GEN:  Well nourished, well developed in no acute distress HEENT: Normal NECK: No JVD; No carotid bruits LYMPHATICS: No lymphadenopathy CARDIAC: RRR, 2/6 early systolic murmur best heard at RUSB, rubs, gallops RESPIRATORY:  Clear to auscultation without rales,  wheezing or rhonchi  ABDOMEN: Soft, non-tender, non-distended MUSCULOSKELETAL:  No edema; No deformity  SKIN: Warm and dry NEUROLOGIC:  Alert and oriented x 3 PSYCHIATRIC:  Normal affect   ASSESSMENT:    1. Heart murmur   2. 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 BP on home monitor fairly well controlled. BP 110/60 here. -Will need continued surveillance TTE every 2-3 years -Emphasized blood pressure control through diet and exercise; has been monitoring at home and has been fairly well controlled. Will follow closely with PCP  #DOE: #Anemia Likely related to underlying anemia. HgB 7.4. ?  Heavy menses. Will need close follow-up with PCP. -Follow-up with PCP for work-up of underlying anemia as likely contributing to fatigue and mild DOE; will reach out to her and ensure she is aware  -Will likely need iron studies and iron supplementation  #Obesity: Discussed with patient the importance of diet, exercise and weight loss as this will help with blood pressure management and decrease CV risk.  Exercise recommendations: Goal of moderate exercise for at least 30 minutes a day, at least 5 times per week.  Exertion level should be approximately a 5/10, if 10 is the most exertion you can perform.  Diet recommendations: Recommend a heart healthy diet such as the Mediterranean diet.  This diet consists of plant based foods, healthy fats, lean meats, olive oil.  It suggests limiting the intake of simple carbohydrates such as white breads, pastries, and pastas.  It also limits the amount of red meat, wine, and dairy products such as cheese that one should consume on a daily basis.   Medication Adjustments/Labs and Tests Ordered: Current medicines are reviewed at length with the patient today.  Concerns regarding medicines are outlined above.  Orders Placed This Encounter  Procedures   EKG  12-Lead   No orders of the defined types were placed in this encounter.   Patient Instructions  Medication Instructions:  Your physician recommends that you continue on your current medications as directed. Please refer to the Current Medication list given to you today.  *If you need a refill on your cardiac medications before your next appointment, please call your pharmacy*   Lab Work: None  If you have labs (blood work) drawn today and your tests are completely normal, you will receive your results only by:  MyChart Message (if you have MyChart) OR  A paper copy in the mail If you have any lab test that is abnormal or we need to change your treatment, we will call you to review the results.   Testing/Procedures: None  Follow-Up: At [facility], you and your health needs are our priority.  As part of our continuing mission to provide you with exceptional heart care, we have created designated Provider Care Teams.  These Care Teams include your primary Cardiologist (physician) and Advanced Practice Providers (APPs -  Physician Assistants and Nurse Practitioners) who all work together to provide you with the care you need, when you need it.  We recommend signing up for the patient portal called "MyChart".  Sign up information is provided on this After Visit Summary.  MyChart is used to connect with patients for Virtual Visits (Telemedicine).  Patients are able to view lab/test results, encounter notes, upcoming appointments, etc.  Non-urgent messages can be sent to your provider as well.   To learn more about what you can do with MyChart, go to [email or web address].    Your next appointment:   24 month(s)  The format for your next appointment:   In Person  Provider:   Laurance Flatten, MD   Other Instructions None     Signed, Meriam Sprague, MD  12/16/2019 9:07 AM    [facility]

## 2019-12-16 ENCOUNTER — Other Ambulatory Visit: Payer: Self-pay

## 2019-12-16 ENCOUNTER — Ambulatory Visit (INDEPENDENT_AMBULATORY_CARE_PROVIDER_SITE_OTHER): Payer: Self-pay | Admitting: Cardiology

## 2019-12-16 ENCOUNTER — Telehealth: Payer: Self-pay | Admitting: Cardiology

## 2019-12-16 ENCOUNTER — Encounter: Payer: Self-pay | Admitting: Cardiology

## 2019-12-16 VITALS — BP 110/60 | HR 70 | Ht 60.0 in | Wt 170.0 lb

## 2019-12-16 DIAGNOSIS — I351 Nonrheumatic aortic (valve) insufficiency: Secondary | ICD-10-CM

## 2019-12-16 DIAGNOSIS — R011 Cardiac murmur, unspecified: Secondary | ICD-10-CM

## 2019-12-16 DIAGNOSIS — D649 Anemia, unspecified: Secondary | ICD-10-CM

## 2019-12-16 NOTE — Patient Instructions (Signed)
Medication Instructions:  Your physician recommends that you continue on your current medications as directed. Please refer to the Current Medication list given to you today.  *If you need a refill on your cardiac medications before your next appointment, please call your pharmacy*   Lab Work: None  If you have labs (blood work) drawn today and your tests are completely normal, you will receive your results only by: Marland Kitchen MyChart Message (if you have MyChart) OR . A paper copy in the mail If you have any lab test that is abnormal or we need to change your treatment, we will call you to review the results.   Testing/Procedures: None  Follow-Up: At [facility], you and your health needs are our priority.  As part of our continuing mission to provide you with exceptional heart care, we have created designated Provider Care Teams.  These Care Teams include your primary Cardiologist (physician) and Advanced Practice Providers (APPs -  Physician Assistants and Nurse Practitioners) who all work together to provide you with the care you need, when you need it.  We recommend signing up for the patient portal called "MyChart".  Sign up information is provided on this After Visit Summary.  MyChart is used to connect with patients for Virtual Visits (Telemedicine).  Patients are able to view lab/test results, encounter notes, upcoming appointments, etc.  Non-urgent messages can be sent to your provider as well.   To learn more about what you can do with MyChart, go to [email or web address].    Your next appointment:   24 month(s)  The format for your next appointment:   In Person  Provider:   Laurance Flatten, MD   Other Instructions None

## 2019-12-16 NOTE — Telephone Encounter (Signed)
Called and left a VM for the patient regarding her hemoglobin of 7.4 that was checked by PCP office. Told her she needs close follow-up with her primary care to ensure this is evaluated.

## 2021-03-16 ENCOUNTER — Other Ambulatory Visit: Payer: Self-pay | Admitting: Internal Medicine

## 2021-04-13 ENCOUNTER — Other Ambulatory Visit: Payer: Self-pay | Admitting: Internal Medicine

## 2021-10-10 ENCOUNTER — Telehealth: Payer: Self-pay | Admitting: Cardiology

## 2021-10-10 DIAGNOSIS — I351 Nonrheumatic aortic (valve) insufficiency: Secondary | ICD-10-CM

## 2021-10-10 NOTE — Telephone Encounter (Signed)
Pts echo is scheduled for 11/15/21 at 0835 and follow-up appt with Dr. Shari Prows is 12/20/21 at 0840.  Pt made aware of appt dates and times, by [facility] Scheduling team.

## 2021-10-10 NOTE — Telephone Encounter (Signed)
Pt is calling scheduling dept to arrange her follow-up appt with Dr. Shari Prows and arrange an echo as well.  Below is reference for Dr. Devin Going last OV note with the pt back on 12/16/19, regarding echo follow-up:   Mild-to-moderate AR: #Murmur: Asymptomatic and active. BP on home monitor fairly well controlled. BP 110/60 here. -Will need continued surveillance TTE every 2-3 years -Emphasized blood pressure control through diet and exercise; has been monitoring at home and has been fairly well controlled. Will follow closely with PCP  Pt is due to have her echo done in Aug 2023 for follow-up of mild-moderate AR. Echo ordered placed for the pt to have done.   Will send a staff message to our [facility] Schedulers to call her back and arrange echo in late August and follow-up appt with Dr. Shari Prows in Sept.    Did leave the pt a detailed message to call the office back to arrange echo in late August 2023 and follow-up appt with Dr. Shari Prows for sometime in September 2023. Left her a detailed message to our [facility] Scheduling team will be calling her back soon to arrange both appts, so she should be on the listen out.  Left her a detailed message to call back with any additional questions or concerns regarding message left.   Echo order placed for the pt to have done in late Aug 2023.  [facility] Scheduling to call her back and arrange both appts.

## 2021-10-10 NOTE — Telephone Encounter (Signed)
Pt states she needs to schedule her annual echocardiogram. I called pt back to schedule her 70mon f/u with Dr. Shari Prows, however no answer.

## 2021-11-15 ENCOUNTER — Ambulatory Visit (HOSPITAL_COMMUNITY): Payer: Self-pay | Attending: Cardiology

## 2021-11-15 DIAGNOSIS — I351 Nonrheumatic aortic (valve) insufficiency: Secondary | ICD-10-CM | POA: Insufficient documentation

## 2021-11-15 LAB — ECHOCARDIOGRAM COMPLETE
Area-P 1/2: 3.6 cm2
P 1/2 time: 719 msec
S' Lateral: 3 cm

## 2021-12-14 NOTE — Progress Notes (Deleted)
Cardiology Office Note:    Date:  12/14/2021   ID:  Melanny, Wire [birth year], MRN [id number]  PCP:  Philip Aspen, Limmie Patricia, MD  [facility] Cardiologist:  Meriam Sprague, MD  [facility] Electrophysiologist:  None   Referring MD: Philip Aspen, Estel*    History of Present Illness:    Karen Rosales is a 36 y.o. 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   Patient was initially seen at her annual physical in 2021 and was noted to have a murmur on examination. She noted some mild DOE and fatigue. TTE 11/11/19 and demonstrated normal LVEF 60-65%, moderately enlarged LA, mild-to-moderate AR. She is now referred to Cardiology for further management.  Was last seen in clinic on 11/2019 where she was doing well. No exertional or heart failure symptoms. Last TTE 10/2021 with LVEF 60-65%, normal RV, trivial MR, mild AI.  Today, ***  Past Medical History:  Diagnosis Date   Obesity (BMI 30.0-34.9)     Past Surgical History:  Procedure Laterality Date   BACK SURGERY     CESAREAN SECTION      Current Medications: No outpatient medications have been marked as taking for the 12/20/21 encounter (Appointment) with Meriam Sprague, MD.     Allergies:   Patient has no known allergies.   Social History   Socioeconomic History   Marital status: Married    Spouse name: Not on file   Number of children: Not on file   Years of education: Not on file   Highest education level: Not on file  Occupational History   Not on file  Tobacco Use   Smoking status: Never   Smokeless tobacco: Never  Vaping Use   Vaping Use: Never used  Substance and Sexual Activity   Alcohol use: Not Currently   Drug use: Never   Sexual activity: Yes    Birth control/protection: Other-see comments    Comment: Tube  Other Topics Concern   Not on file  Social History Narrative   Not on file   Social Determinants of Health    Financial Resource Strain: Not on file  Food Insecurity: Not on file  Transportation Needs: Not on file  Physical Activity: Not on file  Stress: Not on file  Social Connections: Not on file     Family History: The patient's family history includes Asthma in her son; Cancer in her maternal grandmother; Early death in her maternal grandmother; Healthy in her father and mother.  ROS:   Please see the history of present illness.    The patient denies chest pain, chest pressure, dyspnea at rest or with exertion, palpitations, PND, orthopnea, or leg swelling. Denies cough, fever, chills. Denies nausea, vomiting. Denies syncope or presyncope. Denies dizziness or lightheadedness. Denies snoring.  EKGs/Labs/Other Studies Reviewed:    The following studies were reviewed today: TTE 12/03/21: IMPRESSIONS     1. Left ventricular ejection fraction, by estimation, is 60 to 65%. The  left ventricle has normal function. The left ventricle has no regional  wall motion abnormalities. Left ventricular diastolic parameters were  normal.   2. Right ventricular systolic function is normal. The right ventricular  size is normal.   3. The mitral valve is normal in structure. Trivial mitral valve  regurgitation. No evidence of mitral stenosis.   4. Mild central AI. The aortic valve is tricuspid. There is mild  calcification of the aortic valve. Aortic valve  regurgitation is mild. No  aortic stenosis is present. Aortic regurgitation PHT measures 719 msec.   5. The inferior vena cava is normal in size with greater than 50%  respiratory variability, suggesting right atrial pressure of 3 mmHg. TTE 10/2019: IMPRESSIONS   1. Left ventricular ejection fraction, by estimation, is 60 to 65%. The  left ventricle has normal function. The left ventricle has no regional  wall motion abnormalities. Left ventricular diastolic parameters are  indeterminate.   2. Right ventricular systolic function is normal. The  right ventricular  size is normal.   3. Left atrial size was moderately dilated.   4. The mitral valve is normal in structure. No evidence of mitral valve  regurgitation. No evidence of mitral stenosis.   5. The aortic valve is tricuspid. Aortic valve regurgitation is mild to  moderate. Mild aortic valve sclerosis is present, with no evidence of  aortic valve stenosis.   6. The inferior vena cava is normal in size with greater than 50%  respiratory variability, suggesting right atrial pressure of 3 mmHg.   EKG:  EKG is  ordered today.  The ekg ordered today demonstrates SR, incomplete RBBB, HR 70  Recent Labs: No results found for requested labs within last 365 days.  Recent Lipid Panel    Component Value Date/Time   CHOL 182 10/23/2019 0737   TRIG 87 10/23/2019 0737   HDL 60 10/23/2019 0737   CHOLHDL 3.0 10/23/2019 0737   LDLCALC 104 (H) 10/23/2019 0737    Physical Exam:    VS:  There were no vitals taken for this visit.    Wt Readings from Last 3 Encounters:  12/16/19 170 lb (77.1 kg)  10/23/19 170 lb 4.8 oz (77.2 kg)     GEN:  Well nourished, well developed in no acute distress HEENT: Normal NECK: No JVD; No carotid bruits LYMPHATICS: No lymphadenopathy CARDIAC: RRR, 2/6 early systolic murmur best heard at RUSB, rubs, gallops RESPIRATORY:  Clear to auscultation without rales, wheezing or rhonchi  ABDOMEN: Soft, non-tender, non-distended MUSCULOSKELETAL:  No edema; No deformity  SKIN: Warm and dry NEUROLOGIC:  Alert and oriented x 3 PSYCHIATRIC:  Normal affect   ASSESSMENT:    No diagnosis found.  PLAN:    In order of problems listed above:  #Mild-to-moderate AR: #Murmur: Asymptomatic and active. BP on home monitor fairly well controlled. BP 110/60 here. Stable on repeat TTE 11/15/21. -Continue with surveillance TTE every 2-3 years -Emphasized blood pressure control through diet and exercise; has been monitoring at home and has been fairly well controlled.  Will follow closely with PCP  #DOE: #Anemia Likely secondary to heavy menses. On iron supplementation.   #Obesity: Discussed with patient the importance of diet, exercise and weight loss as this will help with blood pressure management and decrease CV risk.  Exercise recommendations: Goal of moderate exercise for at least 30 minutes a day, at least 5 times per week.  Exertion level should be approximately a 5/10, if 10 is the most exertion you can perform.  Diet recommendations: Recommend a heart healthy diet such as the Mediterranean diet.  This diet consists of plant based foods, healthy fats, lean meats, olive oil.  It suggests limiting the intake of simple carbohydrates such as white breads, pastries, and pastas.  It also limits the amount of red meat, wine, and dairy products such as cheese that one should consume on a daily basis.   Medication Adjustments/Labs and Tests Ordered: Current medicines are reviewed at length  with the patient today.  Concerns regarding medicines are outlined above.  No orders of the defined types were placed in this encounter.  No orders of the defined types were placed in this encounter.   There are no Patient Instructions on file for this visit.   Signed, Freada Bergeron, MD  12/14/2021 1:55 PM    [place]

## 2021-12-20 ENCOUNTER — Ambulatory Visit: Payer: Self-pay | Attending: Cardiology | Admitting: Cardiology

## 2021-12-20 ENCOUNTER — Encounter: Payer: Self-pay | Admitting: Cardiology

## 2021-12-20 VITALS — BP 112/72 | HR 56 | Ht 60.0 in | Wt 175.8 lb

## 2021-12-20 DIAGNOSIS — I351 Nonrheumatic aortic (valve) insufficiency: Secondary | ICD-10-CM

## 2021-12-20 DIAGNOSIS — N920 Excessive and frequent menstruation with regular cycle: Secondary | ICD-10-CM

## 2021-12-20 DIAGNOSIS — I340 Nonrheumatic mitral (valve) insufficiency: Secondary | ICD-10-CM

## 2021-12-20 DIAGNOSIS — Z79899 Other long term (current) drug therapy: Secondary | ICD-10-CM

## 2021-12-20 DIAGNOSIS — D500 Iron deficiency anemia secondary to blood loss (chronic): Secondary | ICD-10-CM

## 2021-12-20 DIAGNOSIS — N922 Excessive menstruation at puberty: Secondary | ICD-10-CM

## 2021-12-20 LAB — BASIC METABOLIC PANEL
CO2: 23 mmol/L (ref 20–29)
Calcium: 9.3 mg/dL (ref 8.7–10.2)
Chloride: 102 mmol/L (ref 96–106)
Creatinine, Ser: 0.53 mg/dL — ABNORMAL LOW (ref 0.57–1.00)
Glucose: 91 mg/dL (ref 70–99)
Potassium: 4 mmol/L (ref 3.5–5.2)
Sodium: 137 mmol/L (ref 134–144)

## 2021-12-20 LAB — CBC WITH DIFFERENTIAL/PLATELET

## 2021-12-20 LAB — LIPID PANEL
Cholesterol, Total: 189 mg/dL (ref 100–199)
HDL: 67 mg/dL (ref >39)
LDL Chol Calc (NIH): 108 mg/dL — ABNORMAL HIGH (ref 0–99)
Triglycerides: 77 mg/dL (ref 0–149)
VLDL Cholesterol Cal: 14 mg/dL (ref 5–40)

## 2021-12-20 LAB — IRON,TIBC AND FERRITIN PANEL
Ferritin: 10 ng/mL — ABNORMAL LOW (ref 15–150)
Iron: 16 ug/dL — ABNORMAL LOW (ref 27–159)
Total Iron Binding Capacity: 425 ug/dL (ref 250–450)
UIBC: 409 ug/dL (ref 131–425)

## 2021-12-20 NOTE — Progress Notes (Signed)
Cardiology Office Note:    Date:  12/20/2021   ID:  [name], [name] 26-Feb-1986, MRN [id number]  PCP:  Isaac Bliss, Rayford Halsted, MD  [facility] Cardiologist:  Freada Bergeron, MD  [facility] Electrophysiologist:  None   Referring MD: Isaac Bliss, Estel*    History of Present Illness:    Karen Rosales is a 36 y.o. 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   Patient was initially seen at her annual physical in 2021 and was noted to have a murmur on examination. She noted some mild DOE and fatigue. TTE 11/11/19 and demonstrated normal LVEF 60-65%, moderately enlarged LA, mild-to-moderate AR. She is now referred to Cardiology for further management.  Was last seen in clinic on 11/2019 where she was doing well. No exertional or heart failure symptoms. Last TTE 10/2021 with LVEF 60-65%, normal RV, trivial MR, mild AI.  Today, the patient states she is doing overall well from a CV standpoint. No exertional chest pain, orthopnea, PND, palpitations or LE edema. For activity she mainly goes walking. Afterwards she is short of breath, although she also attributes this to iron deficiency anemia. Currently she is only taking 1 iron tablet daily. If she tries to take more as prescribed (3x a day), she develops GI upset. Of note, she endorses heavy menses lasting about a week. Her last hemoglobin was 7.4. She admits that she has not had follow-up for her anemia or heavy menses in >1 year. She is open to being referred to an OBGYN.  She believes her weight has been stable lately.  She denies any palpitations, or peripheral edema. No lightheadedness, headaches, syncope, orthopnea, or PND.   Past Medical History:  Diagnosis Date   Obesity (BMI 30.0-34.9)     Past Surgical History:  Procedure Laterality Date   BACK SURGERY     CESAREAN SECTION      Current Medications: Current Meds  Medication Sig   ferrous  sulfate 325 (65 FE) MG EC tablet TAKE 1 TABLET BY MOUTH THREE TIMES A DAY WITH MEALS   ibuprofen (ADVIL) 200 MG tablet Take 800 mg by mouth as needed.     Allergies:   Patient has no known allergies.   Social History   Socioeconomic History   Marital status: Married    Spouse name: Not on file   Number of children: Not on file   Years of education: Not on file   Highest education level: Not on file  Occupational History   Not on file  Tobacco Use   Smoking status: Never   Smokeless tobacco: Never  Vaping Use   Vaping Use: Never used  Substance and Sexual Activity   Alcohol use: Not Currently   Drug use: Never   Sexual activity: Yes    Birth control/protection: Other-see comments    Comment: Tube  Other Topics Concern   Not on file  Social History Narrative   Not on file   Social Determinants of Health   Financial Resource Strain: Not on file  Food Insecurity: Not on file  Transportation Needs: Not on file  Physical Activity: Not on file  Stress: Not on file  Social Connections: Not on file     Family History: The patient's family history includes Asthma in her son; Cancer in her maternal grandmother; Early death in her maternal grandmother; Healthy in her father and mother.  ROS:   Review of Systems  Constitutional:  Negative for chills and fever.  HENT:  Negative for nosebleeds and tinnitus.   Eyes:  Negative for blurred vision and pain.  Respiratory:  Positive for shortness of breath. Negative for cough, hemoptysis and stridor.   Cardiovascular:  Positive for chest pain. Negative for palpitations, orthopnea, claudication, leg swelling and PND.  Gastrointestinal:  Negative for blood in stool, diarrhea, nausea and vomiting.  Genitourinary:  Negative for dysuria and hematuria.  Musculoskeletal:  Negative for falls.  Neurological:  Negative for dizziness, loss of consciousness and headaches.  Psychiatric/Behavioral:  Negative for depression, hallucinations and  substance abuse. The patient does not have insomnia.      EKGs/Labs/Other Studies Reviewed:    The following studies were reviewed today:  TTE 11/15/21: IMPRESSIONS   1. Left ventricular ejection fraction, by estimation, is 60 to 65%. The  left ventricle has normal function. The left ventricle has no regional  wall motion abnormalities. Left ventricular diastolic parameters were  normal.   2. Right ventricular systolic function is normal. The right ventricular  size is normal.   3. The mitral valve is normal in structure. Trivial mitral valve  regurgitation. No evidence of mitral stenosis.   4. Mild central AI. The aortic valve is tricuspid. There is mild  calcification of the aortic valve. Aortic valve regurgitation is mild. No  aortic stenosis is present. Aortic regurgitation PHT measures 719 msec.   5. The inferior vena cava is normal in size with greater than 50%  respiratory variability, suggesting right atrial pressure of 3 mmHg.  TTE 10/2019: IMPRESSIONS   1. Left ventricular ejection fraction, by estimation, is 60 to 65%. The  left ventricle has normal function. The left ventricle has no regional  wall motion abnormalities. Left ventricular diastolic parameters are  indeterminate.   2. Right ventricular systolic function is normal. The right ventricular  size is normal.   3. Left atrial size was moderately dilated.   4. The mitral valve is normal in structure. No evidence of mitral valve  regurgitation. No evidence of mitral stenosis.   5. The aortic valve is tricuspid. Aortic valve regurgitation is mild to  moderate. Mild aortic valve sclerosis is present, with no evidence of  aortic valve stenosis.   6. The inferior vena cava is normal in size with greater than 50%  respiratory variability, suggesting right atrial pressure of 3 mmHg.   EKG:  EKG is personally reviewed. 12/20/2021:  Sinus bradycardia. Rate 56 bpm. 12/16/2019:  SR, incomplete RBBB, HR 70  Recent  Labs: No results found for requested labs within last 365 days.   Recent Lipid Panel    Component Value Date/Time   CHOL 182 10/23/2019 0737   TRIG 87 10/23/2019 0737   HDL 60 10/23/2019 0737   CHOLHDL 3.0 10/23/2019 0737   LDLCALC 104 (H) 10/23/2019 0737    Physical Exam:    VS:  BP 112/72   Pulse (!) 56   Ht 5' (1.524 m)   Wt 175 lb 12.8 oz (79.7 kg)   SpO2 99%   BMI 34.33 kg/m     Wt Readings from Last 3 Encounters:  12/20/21 175 lb 12.8 oz (79.7 kg)  12/16/19 170 lb (77.1 kg)  10/23/19 170 lb 4.8 oz (77.2 kg)     GEN:  Comfortable, NAD HEENT: Normal NECK: No JVD; No carotid bruits CARDIAC: RRR, 2/6 early systolic murmur best heard at RUSB, No rubs, Np gallops RESPIRATORY:  Clear to auscultation without rales, wheezing or rhonchi  ABDOMEN:  Soft, non-tender, non-distended MUSCULOSKELETAL:  No edema; No deformity  SKIN: Warm and dry NEUROLOGIC:  Alert and oriented x 3 PSYCHIATRIC:  Normal affect   ASSESSMENT:    1. Mild aortic regurgitation   2. Mild mitral regurgitation     PLAN:    In order of problems listed above:  #Iron Deficiency Anemia: HgB 7.4 in 2021. Likely related to heavy menses. Currently taking one iron supplement pill per day. Has not had regular follow-up for her anemia or heavy menses. Will refer to OBGYN and check CBC and iron studies today. This is likely the main cause of her DOE. -Refer to OBGYN -Continue iron supplementation -Check iron studies and CBC  #Mild-to-moderate AR: #Murmur: Stable on TTE in 10/2021. -Continue with surveillance TTE every 2-3 years -BP well controlled.   #Obesity: Discussed with patient the importance of diet, exercise and weight loss as this will help with blood pressure management and decrease CV risk.  Exercise recommendations: Goal of moderate exercise for at least 30 minutes a day, at least 5 times per week.  Exertion level should be approximately a 5/10, if 10 is the most exertion you can  perform.  Diet recommendations: Recommend a heart healthy diet such as the Mediterranean diet.  This diet consists of plant based foods, healthy fats, lean meats, olive oil.  It suggests limiting the intake of simple carbohydrates such as white breads, pastries, and pastas.  It also limits the amount of red meat, wine, and dairy products such as cheese that one should consume on a daily basis.  Follow-up:  1 year.  Medication Adjustments/Labs and Tests Ordered: Current medicines are reviewed at length with the patient today.  Concerns regarding medicines are outlined above.   No orders of the defined types were placed in this encounter.  No orders of the defined types were placed in this encounter.  There are no Patient Instructions on file for this visit.   I,Mathew Stumpf,acting as a Education administrator for Freada Bergeron, MD.,have documented all relevant documentation on the behalf of Freada Bergeron, MD,as directed by  Freada Bergeron, MD while in the presence of Freada Bergeron, MD.  I, Freada Bergeron, MD, have reviewed all documentation for this visit. The documentation on 12/20/21 for the exam, diagnosis, procedures, and orders are all accurate and complete.   Signed, Freada Bergeron, MD  12/20/2021 9:26 AM    [facility]

## 2021-12-20 NOTE — Patient Instructions (Signed)
Medication Instructions:   Your physician recommends that you continue on your current medications as directed. Please refer to the Current Medication list given to you today.  *If you need a refill on your cardiac medications before your next appointment, please call your pharmacy*   You have been referred to [facility]   Lab Work:  TODAY--CBC W DIFF, BMET, HEMOGLOBIN A1C, LIPIDS, IRON, TIBC, AND FERRITIN PANEL.  If you have labs (blood work) drawn today and your tests are completely normal, you will receive your results only by: [place] (if you have MyChart) OR A paper copy in the mail If you have any lab test that is abnormal or we need to change your treatment, we will call you to review the results.   Follow-Up: At [facility], you and your health needs are our priority.  As part of our continuing mission to provide you with exceptional heart care, we have created designated Provider Care Teams.  These Care Teams include your primary Cardiologist (physician) and Advanced Practice Providers (APPs -  Physician Assistants and Nurse Practitioners) who all work together to provide you with the care you need, when you need it.  We recommend signing up for the patient portal called "MyChart".  Sign up information is provided on this After Visit Summary.  MyChart is used to connect with patients for Virtual Visits (Telemedicine).  Patients are able to view lab/test results, encounter notes, upcoming appointments, etc.  Non-urgent messages can be sent to your provider as well.   To learn more about what you can do with MyChart, go to [email or web address].    Your next appointment:   1 year(s)  The format for your next appointment:   In Person  Provider:   Freada Bergeron, MD      Important Information About Sugar

## 2021-12-21 LAB — CBC WITH DIFFERENTIAL/PLATELET
Hematocrit: 29.6 % — ABNORMAL LOW (ref 34.0–46.6)
Immature Granulocytes: 0 %
MCV: 72 fL — ABNORMAL LOW (ref 79–97)
Monocytes Absolute: 0.3 10*3/uL (ref 0.1–0.9)
Neutrophils Absolute: 2.4 10*3/uL (ref 1.4–7.0)
Platelets: 398 10*3/uL (ref 150–450)
RBC: 4.11 x10E6/uL (ref 3.77–5.28)
WBC: 4.5 10*3/uL (ref 3.4–10.8)

## 2021-12-21 LAB — BASIC METABOLIC PANEL
BUN/Creatinine Ratio: 15 (ref 9–23)
BUN: 8 mg/dL (ref 6–20)
eGFR: 121 mL/min/{1.73_m2} (ref >59)

## 2021-12-21 LAB — HEMOGLOBIN A1C
Est. average glucose Bld gHb Est-mCnc: 97 mg/dL
Hgb A1c MFr Bld: 5 % (ref 4.8–5.6)

## 2021-12-21 LAB — IRON,TIBC AND FERRITIN PANEL: Iron Saturation: 4 % — CL (ref 15–55)

## 2021-12-21 LAB — LIPID PANEL: Chol/HDL Ratio: 2.8 ratio (ref 0.0–4.4)

## 2021-12-22 ENCOUNTER — Telehealth: Payer: Self-pay

## 2021-12-22 NOTE — Telephone Encounter (Signed)
Spoke with Karen Rosales and advised of Dr Jacolyn Reedy comments and recommendations as below.  Karen Rosales verbalizes understanding and agrees with current plan.

## 2021-12-22 NOTE — Telephone Encounter (Signed)
-----   Message from Freada Bergeron, MD sent at 12/21/2021  8:47 PM EDT ----- Patient was anemic with hemoglobin 8.8. Her iron is very low. This means it is really important she see OBGYN to discuss her heavy menses and she needs to continue the iron supplementation. Her kidney function and electrolytes are normal. A1C is normal. Lipids are okay.

## 2022-05-17 ENCOUNTER — Encounter: Payer: Self-pay | Admitting: Obstetrics and Gynecology

## 2022-07-17 ENCOUNTER — Other Ambulatory Visit: Payer: Self-pay | Admitting: Internal Medicine

## 2022-07-17 ENCOUNTER — Ambulatory Visit (INDEPENDENT_AMBULATORY_CARE_PROVIDER_SITE_OTHER): Payer: Self-pay | Admitting: Obstetrics and Gynecology

## 2022-07-17 ENCOUNTER — Other Ambulatory Visit: Payer: Self-pay

## 2022-07-17 ENCOUNTER — Encounter: Payer: Self-pay | Admitting: Obstetrics and Gynecology

## 2022-07-17 VITALS — BP 129/84 | HR 79 | Wt 176.8 lb

## 2022-07-17 DIAGNOSIS — N939 Abnormal uterine and vaginal bleeding, unspecified: Secondary | ICD-10-CM

## 2022-07-17 MED ORDER — NORETHINDRONE 0.35 MG PO TABS: 1.0000 | ORAL_TABLET | Freq: Every day | ORAL | 11 refills | Status: DC

## 2022-07-17 NOTE — Progress Notes (Signed)
NEW GYNECOLOGY PATIENT Patient name: Karen Rosales MRN [id number]  Date of birth: February 03, 1986 Chief Complaint:   new gyn     History:  Karen Rosales is a 37 y.o. No obstetric history on file. being seen today for heavy menstrual bleeding.  Heavy regular menses for the last 2 years. Mild cramping but mostly bothered by flow. No skin or weight change, no breast or nipple changes and no abnormal vaginal discharge CHC: no tobacco use Has been on iron daily  Feels like her ice craving has decreased Feels the menses have really gotten worse over the last few years  Does not have mychart log in      Gynecologic History Patient's last menstrual period was 07/09/2022 (approximate). Contraception: tubal ligation interval, 14 years ago laparoscopically  Last Pap: 07/2016, NILM HR HPV neg Last Mammogram: n/a Last Colonoscopy: n/a  Obstetric History OB History  No obstetric history on file.    Past Medical History:  Diagnosis Date   Obesity (BMI 30.0-34.9)     Past Surgical History:  Procedure Laterality Date   BACK SURGERY     CESAREAN SECTION      Current Outpatient Medications on File Prior to Visit  Medication Sig Dispense Refill   ferrous sulfate 325 (65 FE) MG EC tablet TAKE 1 TABLET BY MOUTH THREE TIMES A DAY WITH MEALS 90 tablet 0   ibuprofen (ADVIL) 200 MG tablet Take 800 mg by mouth as needed.     No current facility-administered medications on file prior to visit.    No Known Allergies  Social History:  reports that she has never smoked. She has never used smokeless tobacco. She reports that she does not currently use alcohol. She reports that she does not use drugs.  Family History  Problem Relation Age of Onset   Healthy Mother    Healthy Father    Cancer Maternal Grandmother    Early death Maternal Grandmother    Asthma Son     The following portions of the patient's history were reviewed and updated as appropriate: allergies, current  medications, past family history, past medical history, past social history, past surgical history and problem list.  Review of Systems Pertinent items noted in HPI and remainder of comprehensive ROS otherwise negative.  Physical Exam:  BP 129/84   Pulse 79   Wt 176 lb 12.8 oz (80.2 kg)   LMP 07/09/2022 (Approximate)   BMI 34.53 kg/m  Physical Exam Vitals and nursing note reviewed.  Constitutional:      Appearance: Normal appearance.  Cardiovascular:     Rate and Rhythm: Normal rate.  Pulmonary:     Effort: Pulmonary effort is normal.     Breath sounds: Normal breath sounds.  Neurological:     General: No focal deficit present.     Mental Status: She is alert and oriented to person, place, and time.  Psychiatric:        Mood and Affect: Mood normal.        Behavior: Behavior normal.        Thought Content: Thought content normal.        Judgment: Judgment normal.        Assessment and Plan:   1. Abnormal uterine bleeding (AUB) Patient has abnormal uterine bleeding .  Will order abnormal uterine bleeding evaluation labs and pelvic ultrasound to evaluate for any structural gynecologic abnormalities.  Will contact patient with these results and plans for further evaluation/management. Discussed role of pap  and EMB - would prefer to do at follow up visit. In the interim will trial POP for bleeding control.   - US PELVIC COMPLETE WITH TRANSVAGINAL; Future - CBC - TSH Rfx on Abnormal to Free T4 - norethindrone (MICRONOR) 0.35 MG tablet; Take 1 tablet (0.35 mg total) by mouth daily.  Dispense: 30 tablet; Refill: 11  No insurance - will need pap with [facility]  Routine preventative health maintenance measures emphasized. Please refer to After Visit Summary for other counseling recommendations.   Follow-up: No follow-ups on file.      Lorriane Shire, MD Obstetrician & Gynecologist, Faculty Practice [facility] [employer],  [facility]

## 2022-07-18 LAB — CBC
Hematocrit: 32.6 % — ABNORMAL LOW (ref 34.0–46.6)
Hemoglobin: 9.9 g/dL — ABNORMAL LOW (ref 11.1–15.9)
MCH: 24.9 pg — ABNORMAL LOW (ref 26.6–33.0)
MCHC: 30.4 g/dL — ABNORMAL LOW (ref 31.5–35.7)
MCV: 82 fL (ref 79–97)
Platelets: 374 10*3/uL (ref 150–450)
RBC: 3.97 x10E6/uL (ref 3.77–5.28)
RDW: 16.1 % — ABNORMAL HIGH (ref 11.7–15.4)
WBC: 4.9 10*3/uL (ref 3.4–10.8)

## 2022-07-18 LAB — TSH RFX ON ABNORMAL TO FREE T4: TSH: 1.3 u[IU]/mL (ref 0.450–4.500)

## 2022-07-18 MED ORDER — FERROUS SULFATE 325 (65 FE) MG PO TBEC: 325.0000 mg | DELAYED_RELEASE_TABLET | Freq: Every day | ORAL | 0 refills | Status: AC

## 2022-07-30 ENCOUNTER — Ambulatory Visit (HOSPITAL_COMMUNITY): Payer: Self-pay

## 2022-09-06 ENCOUNTER — Ambulatory Visit (HOSPITAL_COMMUNITY)
Admission: RE | Admit: 2022-09-06 | Discharge: 2022-09-06 | Disposition: A | Discharge status: Home / Self Care | Payer: Self-pay | Source: Ambulatory Visit | Attending: Obstetrics and Gynecology | Admitting: Obstetrics and Gynecology

## 2022-09-06 DIAGNOSIS — N939 Abnormal uterine and vaginal bleeding, unspecified: Secondary | ICD-10-CM

## 2022-09-07 ENCOUNTER — Telehealth: Payer: Self-pay | Admitting: *Deleted

## 2022-09-07 NOTE — Telephone Encounter (Signed)
-----   Message from Lorriane Shire, MD sent at 09/07/2022  8:10 AM EDT ----- Notify of normal ultrasound and blood tests. Recommend follow up to see how well norethindrone is helping with bleeding.

## 2022-09-07 NOTE — Telephone Encounter (Signed)
I called Charlotta and informed her of results and recommendations per Dr. Briscoe Deutscher and that front office will contact her with an appointment. She voices understanding. Nancy Fetter

## 2022-09-28 ENCOUNTER — Ambulatory Visit: Payer: Self-pay

## 2022-10-03 ENCOUNTER — Other Ambulatory Visit: Payer: Self-pay

## 2022-10-03 ENCOUNTER — Ambulatory Visit (INDEPENDENT_AMBULATORY_CARE_PROVIDER_SITE_OTHER): Payer: Self-pay | Admitting: Obstetrics and Gynecology

## 2022-10-03 VITALS — BP 130/66 | HR 71 | Wt 177.2 lb

## 2022-10-03 DIAGNOSIS — N939 Abnormal uterine and vaginal bleeding, unspecified: Secondary | ICD-10-CM

## 2022-10-03 MED ORDER — TRANEXAMIC ACID 650 MG PO TABS: 1300.0000 mg | ORAL_TABLET | Freq: Three times a day (TID) | ORAL | 2 refills | Status: DC

## 2022-10-03 NOTE — Patient Instructions (Signed)
Endometrial Ablation Endometrial ablation is a procedure that destroys the thin inner layer of the lining of the uterus (endometrium). This procedure may be done: To stop heavy menstrual periods. To stop bleeding that is causing anemia. To control irregular bleeding. To treat bleeding caused by small tumors (fibroids) in the endometrium. This procedure is often done as an alternative to major surgery, such as removal of the uterus and cervix (hysterectomy). As a result of this procedure: You may not be able to have children. However, if you have not yet gone through menopause: You may still have a small chance of getting pregnant. You will need to use a reliable method of birth control after the procedure to prevent pregnancy. You may stop having a menstrual period, or you may have only a small amount of bleeding during your period. Menstruation may return several years after the procedure. Tell a health care provider about: Any allergies you have. All medicines you are taking, including vitamins, herbs, eye drops, creams, and over-the-counter medicines. Any problems you or family members have had with the use of anesthetic medicines. Any blood disorders you have. Any surgeries you have had. Any medical conditions you have. Whether you are pregnant or may be pregnant. What are the risks? Generally, this is a safe procedure. However, problems may occur, including: A hole (perforation) in the uterus or bowel. Infection in the uterus, bladder, or vagina. Bleeding. Allergic reaction to medicines. Damage to nearby structures or organs. An air bubble in the lung (air embolus). Problems with pregnancy. Failure of the procedure. Decreased ability to diagnose cancer in the endometrium. Scar tissue forms after the procedure, making it more difficult to get a sample of the uterine lining. What happens before the procedure? Medicines Ask your health care provider about: Changing or stopping your  regular medicines. This is especially important if you take diabetes medicines or blood thinners. Taking medicines such as aspirin and ibuprofen. These medicines can thin your blood. Do not take these medicines before your procedure if your doctor tells you not to take them. Taking over-the-counter medicines, vitamins, herbs, and supplements. Tests You will have tests of your endometrium to make sure there are no precancerous cells or cancer cells present. You may have an ultrasound of the uterus. General instructions Do not use any products that contain nicotine or tobacco for at least 4 weeks before the procedure. These include cigarettes, chewing tobacco, and vaping devices, such as e-cigarettes. If you need help quitting, ask your health care provider. You may be given medicines to thin the endometrium. Ask your health care provider what steps will be taken to help prevent infection. These steps may include: Removing hair at the surgery site. Washing skin with a germ-killing soap. Taking antibiotic medicine. Plan to have a responsible adult take you home from the hospital or clinic. Plan to have a responsible adult care for you for the time you are told after you leave the hospital or clinic. This is important. What happens during the procedure?  You will lie on an exam table with your feet and legs supported as in a pelvic exam. An IV will be inserted into one of your veins. You will be given a medicine to help you relax (sedative). A surgical tool with a light and camera (resectoscope) will be inserted into your vagina and moved into your uterus. This allows your surgeon to see inside your uterus. Endometrial tissue will be destroyed and removed, using one of the following methods: Radiofrequency. This  uses an electrical current to destroy the endometrium. Cryotherapy. This uses extreme cold to freeze the endometrium. Heated fluid. This uses a heated salt and water (saline) solution to  destroy the endometrium. Microwave. This uses high-energy microwaves to heat up the endometrium and destroy it. Thermal balloon. This involves inserting a catheter with a balloon tip into the uterus. The balloon tip is filled with heated fluid to destroy the endometrium. The procedure may vary among health care providers and hospitals. What happens after the procedure? Your blood pressure, heart rate, breathing rate, and blood oxygen level will be monitored until you leave the hospital or clinic. You may have vaginal bleeding for 4-6 weeks after the procedure. You may also have: Cramps. A thin, watery vaginal discharge that is light pink or brown. A need to urinate more than usual. Nausea. If you were given a sedative during the procedure, it can affect you for several hours. Do not drive or operate machinery until your health care provider says that it is safe. Do not have sex or insert anything into your vagina until your health care provider says it is safe. Summary Endometrial ablation is done to treat many causes of heavy menstrual bleeding. The procedure destroys the thin inner layer of the lining of the uterus (endometrium). This procedure is often done as an alternative to major surgery, such as removal of the uterus and cervix (hysterectomy). Plan to have a responsible adult take you home from the hospital or clinic. This information is not intended to replace advice given to you by your health care provider. Make sure you discuss any questions you have with your health care provider. Document Revised: 09/24/2019 Document Reviewed: 09/24/2019 Elsevier Patient Education  2024 [employer].

## 2022-10-03 NOTE — Progress Notes (Signed)
GYNECOLOGY VISIT  Patient name: Karen Rosales MRN [id number]  Date of birth: Aug 25, 1985 Chief Complaint:   Gynecologic Exam   History:  Karen Rosales is a 37 y.o. No obstetric history on file. being seen today for AUB following up. Did not see improvement with micronor. Would like to try different medication and interested in ablation but would like to no cost without coverage.   Has BCCCP pap scheduled for later in the year  No contraindication to [facility] use  Past Medical History:  Diagnosis Date   Obesity (BMI 30.0-34.9)     Past Surgical History:  Procedure Laterality Date   BACK SURGERY     CESAREAN SECTION      The following portions of the patient's history were reviewed and updated as appropriate: allergies, current medications, past family history, past medical history, past social history, past surgical history and problem list.   Health Maintenance:   Last pap No results found for: "DIAGPAP", "HPVHIGH", "ADEQPAP" Last mammogram: n/a   Review of Systems:  Pertinent items are noted in HPI. Comprehensive review of systems was otherwise negative.   Objective:  Physical Exam BP 130/66   Pulse 71   Wt 177 lb 3.2 oz (80.4 kg)   LMP 10/02/2022 (Exact Date)   BMI 34.61 kg/m    Physical Exam Vitals and nursing note reviewed.  Constitutional:      Appearance: Normal appearance.  HENT:     Head: Normocephalic and atraumatic.  Pulmonary:     Effort: Pulmonary effort is normal.  Skin:    General: Skin is warm and dry.  Neurological:     General: No focal deficit present.     Mental Status: She is alert.  Psychiatric:        Mood and Affect: Mood normal.        Behavior: Behavior normal.        Thought Content: Thought content normal.        Judgment: Judgment normal.      Labs and Imaging US PELVIC COMPLETE WITH TRANSVAGINAL  Result Date: 09/06/2022 CLINICAL DATA:  Heavy menstrual bleeding. EXAM: TRANSABDOMINAL AND TRANSVAGINAL  ULTRASOUND OF PELVIS TECHNIQUE: Both transabdominal and transvaginal ultrasound examinations of the pelvis were performed. Transabdominal technique was performed for global imaging of the pelvis including uterus, ovaries, adnexal regions, and pelvic cul-de-sac. It was necessary to proceed with endovaginal exam following the transabdominal exam to visualize the endometrium and ovaries. COMPARISON:  None Available. FINDINGS: Uterus Measurements: 11.0 x 5.2 x 7.0 cm = volume: 208 mL. No fibroids or other mass visualized. Endometrium Thickness: 6.9 mm.  No focal abnormality visualized. Right ovary Measurements: 2.8 x 2.6 x 2.8 cm = volume: 12.6 mL. Normal appearance/no adnexal mass. Left ovary Measurements: 3.6 x 1.5 x 2.1 cm = volume: 6.1 mL. Normal appearance/no adnexal mass. Other findings No abnormal free fluid. IMPRESSION: Normal study. Electronically Signed   By: Gerome Sam III M.D.   On: 09/06/2022 19:07       Assessment & Plan:   1. Abnormal uterine bleeding (AUB) Stop micronor and trial of lysteda, goodrx coupon given. Reached out to scheduler regarding cost of ablation without coverage. Encouraged to apply for financial aid program. Given information about ablation.  - tranexamic acid (LYSTEDA) 650 MG TABS tablet; Take 2 tablets (1,300 mg total) by mouth 3 (three) times daily. Take during menses for a maximum of five days  Dispense: 30 tablet; Refill: 2   Routine preventative health maintenance  measures emphasized.  Lorriane Shire, MD [facility], [facility]

## 2022-10-16 ENCOUNTER — Ambulatory Visit: Payer: Self-pay

## 2022-10-26 ENCOUNTER — Other Ambulatory Visit: Payer: Self-pay | Admitting: Internal Medicine

## 2022-11-14 ENCOUNTER — Other Ambulatory Visit: Payer: Self-pay | Admitting: Hematology and Oncology

## 2022-11-14 VITALS — Wt 178.0 lb

## 2022-11-14 DIAGNOSIS — Z124 Encounter for screening for malignant neoplasm of cervix: Secondary | ICD-10-CM

## 2022-11-14 NOTE — Progress Notes (Signed)
Patient: Karen Rosales           Date of Birth: 1985-04-29           MRN: [id number] Visit Date: 11/14/2022 PCP: Philip Aspen, Limmie Patricia, MD     Cervical Exam Pap smear completed: Pap test Abnormal Observations: Normal exam  Recommendations: Repeat in 5 years if normal and negative HPV.      Patient's History Patient Active Problem List   Diagnosis Date Noted   Obesity (BMI 30.0-34.9)    BACK PAIN, LOW 05/16/2006   SCOLIOSIS 05/16/2006   Past Medical History:  Diagnosis Date   Obesity (BMI 30.0-34.9)     Family History  Problem Relation Age of Onset   Healthy Mother    Healthy Father    Cancer Maternal Grandmother    Early death Maternal Grandmother    Asthma Son     Social History   Occupational History   Not on file  Tobacco Use   Smoking status: Never   Smokeless tobacco: Never  Vaping Use   Vaping status: Never Used  Substance and Sexual Activity   Alcohol use: Not Currently   Drug use: Never   Sexual activity: Yes    Birth control/protection: Other-see comments    Comment: Tube

## 2022-11-22 LAB — CYTOLOGY - PAP
Adequacy: ABSENT
Comment: NEGATIVE
Diagnosis: NEGATIVE
High risk HPV: NEGATIVE

## 2022-12-17 ENCOUNTER — Ambulatory Visit: Payer: Self-pay | Attending: Cardiology | Admitting: Cardiology

## 2022-12-17 ENCOUNTER — Encounter: Payer: Self-pay | Admitting: Cardiology

## 2022-12-17 VITALS — BP 116/60 | HR 56 | Ht 60.0 in | Wt 179.6 lb

## 2022-12-17 DIAGNOSIS — I351 Nonrheumatic aortic (valve) insufficiency: Secondary | ICD-10-CM

## 2022-12-17 NOTE — Patient Instructions (Signed)
Medication Instructions:  The current medical regimen is effective;  continue present plan and medications.  *If you need a refill on your cardiac medications before your next appointment, please call your pharmacy*  Testing/Procedures: Your physician has requested that you have an echocardiogram in 3 years. Echocardiography is a painless test that uses sound waves to create images of your heart. It provides your doctor with information about the size and shape of your heart and how well your heart's chambers and valves are working. This procedure takes approximately one hour. There are no restrictions for this procedure. Please do NOT wear cologne, perfume, aftershave, or lotions (deodorant is allowed). Please arrive 15 minutes prior to your appointment time.    Follow-Up: At [facility], you and your health needs are our priority.  As part of our continuing mission to provide you with exceptional heart care, we have created designated Provider Care Teams.  These Care Teams include your primary Cardiologist (physician) and Advanced Practice Providers (APPs -  Physician Assistants and Nurse Practitioners) who all work together to provide you with the care you need, when you need it.  We recommend signing up for the patient portal called "MyChart".  Sign up information is provided on this After Visit Summary.  MyChart is used to connect with patients for Virtual Visits (Telemedicine).  Patients are able to view lab/test results, encounter notes, upcoming appointments, etc.  Non-urgent messages can be sent to your provider as well.   To learn more about what you can do with MyChart, go to [email or web address].    Your next appointment:   3 year(s)  Provider:   Dr Donato Schultz

## 2022-12-17 NOTE — Progress Notes (Signed)
  Cardiology Office Note:  .   Date:  12/17/2022  ID:  Karen Rosales, DOB 23-Mar-1985, MRN [id number] PCP: Philip Aspen, Limmie Patricia, MD  [facility] Providers Cardiologist:  Meriam Sprague, MD    History of Present Illness: .   Karen Rosales is a 37 y.o. 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 She described no significant shortness of breath and chest discomfort. The patient denied experiencing any fainting episodes or unusual symptoms related to her heart condition.  In addition to her cardiac issues, the patient has a history of anemia, possibly related to heavy menstrual periods. She confirmed ongoing collaboration with her OB-GYN to manage this condition.  The patient denied any family history of heart disease and confirmed she is a non-smoker. She reported no new symptoms or changes in her health status since her last cardiac evaluation.  The patient's cholesterol and hemoglobin A1c levels were last checked in October, with results within normal limits. However, she had a history of low hemoglobin levels due to anemia. The patient expressed no concerns about her current health status or the progression of her heart condition.         Studies Reviewed: Marland Kitchen   EKG Interpretation Date/Time:  Monday December 17 2022 09:44:50 EDT Ventricular Rate:  56 PR Interval:  136 QRS Duration:  96 QT Interval:  446 QTC Calculation: 430 R Axis:   62  Text Interpretation: Sinus bradycardia with sinus arrhythmia No previous ECGs available Confirmed by Donato Schultz ([id number]) on 12/17/2022 9:51:26 AM    LABS LDL: 108 (12/2021) HDL: 67 (12/2021) Hemoglobin A1c: 5% (12/2021)  DIAGNOSTIC Echocardiogram: Mild aortic valve regurgitation (2023)  Risk Assessment/Calculations:             Physical Exam:   VS:  BP 116/60 (BP Location: Left Arm, Patient Position: Sitting, Cuff Size: Large)   Pulse (!) 56   Ht 5' (1.524 m)   Wt 179 lb 9.6 oz (81.5 kg)   SpO2 99%   BMI 35.08 kg/m    Wt Readings from Last 3 Encounters:  12/17/22 179 lb 9.6 oz (81.5 kg)  11/14/22 178 lb (80.7 kg)  10/03/22 177 lb 3.2 oz (80.4 kg)    GEN: Well nourished, well developed in no acute distress NECK: No JVD; No carotid bruits CARDIAC: RRR, soft systolic murmurs, rubs, gallops RESPIRATORY:  Clear to auscultation without rales, wheezing or rhonchi  ABDOMEN: Soft, non-tender, non-distended EXTREMITIES:  No edema; No deformity   ASSESSMENT AND PLAN: .    Assessment and Plan    Aortic Valve Regurgitation Mild severity noted on echocardiogram in 2023. No significant change in symptoms. No shortness of breath or chest pain. -Schedule follow-up echocardiogram in 3 years (2027) to monitor for progression of leakage.  Anemia History of anemia due to heavy periods. Patient is under the care of an OB/GYN for this issue. -Continue current management with OB/GYN.  Hyperlipidemia LDL 108, HDL 67 as of October 2023. -Continue current lifestyle modifications.  General Health Maintenance Hemoglobin A1c of 5, no signs of diabetes. -Continue current lifestyle modifications.            Dispo: 3 years with echo or earlier if symptoms arise.   Signed, Donato Schultz, MD

## 2023-02-10 ENCOUNTER — Other Ambulatory Visit: Payer: Self-pay | Admitting: Obstetrics and Gynecology

## 2023-02-10 DIAGNOSIS — N939 Abnormal uterine and vaginal bleeding, unspecified: Secondary | ICD-10-CM

## 2023-02-11 ENCOUNTER — Encounter: Payer: Self-pay | Admitting: Obstetrics and Gynecology

## 2023-02-12 ENCOUNTER — Other Ambulatory Visit: Payer: Self-pay | Admitting: Obstetrics and Gynecology

## 2024-03-02 ENCOUNTER — Other Ambulatory Visit: Payer: Self-pay | Admitting: Obstetrics and Gynecology

## 2024-03-02 DIAGNOSIS — N939 Abnormal uterine and vaginal bleeding, unspecified: Secondary | ICD-10-CM

## 2024-04-20 ENCOUNTER — Telehealth: Payer: Self-pay | Admitting: Obstetrics and Gynecology

## 2024-05-26 ENCOUNTER — Telehealth: Payer: Self-pay | Admitting: Obstetrics and Gynecology
